# Patient Record
Sex: Male | Born: 1995
Health system: Southern US, Community
[De-identification: ages and names within clinical notes are randomized; demographics above are authoritative.]

## PROBLEM LIST (undated history)

## (undated) DIAGNOSIS — E785 Hyperlipidemia, unspecified: Secondary | ICD-10-CM

## (undated) DIAGNOSIS — IMO0001 Reserved for inherently not codable concepts without codable children: Secondary | ICD-10-CM

## (undated) DIAGNOSIS — K219 Gastro-esophageal reflux disease without esophagitis: Secondary | ICD-10-CM

## (undated) DIAGNOSIS — L309 Dermatitis, unspecified: Secondary | ICD-10-CM

## (undated) HISTORY — DX: Hyperlipidemia, unspecified: E78.5

## (undated) HISTORY — PX: WISDOM TOOTH EXTRACTION: SHX21

## (undated) HISTORY — DX: Gastro-esophageal reflux disease without esophagitis: K21.9

## (undated) HISTORY — DX: Dermatitis, unspecified: L30.9

---
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
## 2013-01-15 ENCOUNTER — Emergency Department (HOSPITAL_COMMUNITY)
Admission: EM | Admit: 2013-01-15 | Discharge: 2013-01-15 | Disposition: A | Discharge status: Home / Self Care | Payer: BC Managed Care – PPO | Attending: Emergency Medicine | Admitting: Emergency Medicine

## 2013-01-15 ENCOUNTER — Encounter (HOSPITAL_COMMUNITY): Payer: Self-pay | Admitting: Emergency Medicine

## 2013-01-15 DIAGNOSIS — Y9389 Activity, other specified: Secondary | ICD-10-CM | POA: Insufficient documentation

## 2013-01-15 DIAGNOSIS — Z23 Encounter for immunization: Secondary | ICD-10-CM | POA: Insufficient documentation

## 2013-01-15 DIAGNOSIS — S0990XA Unspecified injury of head, initial encounter: Secondary | ICD-10-CM

## 2013-01-15 DIAGNOSIS — M79602 Pain in left arm: Secondary | ICD-10-CM

## 2013-01-15 DIAGNOSIS — Z8719 Personal history of other diseases of the digestive system: Secondary | ICD-10-CM | POA: Insufficient documentation

## 2013-01-15 DIAGNOSIS — S0091XA Abrasion of unspecified part of head, initial encounter: Secondary | ICD-10-CM

## 2013-01-15 DIAGNOSIS — Y92410 Unspecified street and highway as the place of occurrence of the external cause: Secondary | ICD-10-CM | POA: Insufficient documentation

## 2013-01-15 DIAGNOSIS — S4980XA Other specified injuries of shoulder and upper arm, unspecified arm, initial encounter: Secondary | ICD-10-CM | POA: Insufficient documentation

## 2013-01-15 DIAGNOSIS — S46909A Unspecified injury of unspecified muscle, fascia and tendon at shoulder and upper arm level, unspecified arm, initial encounter: Secondary | ICD-10-CM | POA: Insufficient documentation

## 2013-01-15 DIAGNOSIS — IMO0002 Reserved for concepts with insufficient information to code with codable children: Secondary | ICD-10-CM | POA: Insufficient documentation

## 2013-01-15 HISTORY — DX: Reserved for inherently not codable concepts without codable children: IMO0001

## 2013-01-15 HISTORY — DX: Gastro-esophageal reflux disease without esophagitis: K21.9

## 2013-01-15 MED ORDER — TETANUS-DIPHTH-ACELL PERTUSSIS 5-2.5-18.5 LF-MCG/0.5 IM SUSP
0.5000 mL | Freq: Once | INTRAMUSCULAR | Status: AC
  Administered 2013-01-15: 0.5 mL via INTRAMUSCULAR
  Filled 2013-01-15: qty 0.5
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
## 2014-02-18 ENCOUNTER — Ambulatory Visit (INDEPENDENT_AMBULATORY_CARE_PROVIDER_SITE_OTHER): Payer: BC Managed Care – PPO | Admitting: Emergency Medicine

## 2014-02-18 VITALS — BP 118/70 | HR 72 | Temp 98.1°F | Resp 18 | Ht 68.25 in | Wt 193.6 lb

## 2014-02-18 DIAGNOSIS — M26609 Unspecified temporomandibular joint disorder, unspecified side: Secondary | ICD-10-CM

## 2014-02-18 DIAGNOSIS — M2660 Temporomandibular joint disorder, unspecified: Secondary | ICD-10-CM

## 2014-02-18 MED ORDER — MELOXICAM 15 MG PO TABS: 15.0000 mg | ORAL_TABLET | Freq: Every day | ORAL | Status: DC

## 2014-02-18 NOTE — Progress Notes (Addendum)
Subjective:  This chart was scribed for Lesle ChrisSteven Margie Urbanowicz, MD by Carl Bestelina Holson, Medical Scribe. This patient was seen in Room 5 and the patient's care was started at 1:19 PM.   Patient ID: Joseph Whitehead, male    DOB: [birth year], 18 y.o.   MRN: [id number]  HPI HPI Comments: Joseph Keasreston L Beal is a 18 y.o. male who presents to the [facility] complaining of constant left ear pain that started 2 weeks ago.  He has been using Q-Tips.  He does not have any wisdom teeth.  His jaw does not lock and he has not been stressed out lately.  He is only depressed when he sees his friends going to school.  He denies cough, SI, sore throat, hearing loss, and rhinorrhea as associated symptoms.  He dropped out of [employer] this semester and is at home trying to figure out what he wants to do with his life.  He has a stable household and can talk to his mom and best friend about his problems.  He denies using drugs or alcohol.    There are no active problems to display for this patient.  Past Medical History  Diagnosis Date  . Reflux    Past Surgical History  Procedure Laterality Date  . Wisdom tooth extraction     Allergies  Allergen Reactions  . Penicillins Other (See Comments)    Pt. Is unsure of reaction.    Prior to Admission medications   Not on File   History   Social History  . Marital Status: Single    Spouse Name: N/A    Number of Children: N/A  . Years of Education: N/A   Occupational History  . Not on file.   Social History Main Topics  . Smoking status: Never Smoker   . Smokeless tobacco: Not on file  . Alcohol Use: No  . Drug Use: No  . Sexual Activity: Not on file   Other Topics Concern  . Not on file   Social History Narrative     Review of Systems  HENT: Positive for ear pain. Negative for hearing loss, rhinorrhea and sore throat.   Respiratory: Negative for cough.   Psychiatric/Behavioral: Negative for suicidal ideas.    Objective:  Physical  Exam  Constitutional: He is oriented to person, place, and time. He appears well-developed and well-nourished.  HENT:  Head: Normocephalic and atraumatic.  Right Ear: Hearing, tympanic membrane, external ear and ear canal normal.  Left Ear: Hearing, tympanic membrane, external ear and ear canal normal.  There is tenderness over the left TMJ. There is tenderness just below the ear. Weber and Rinne  testing are normal  Eyes: EOM are normal.  Neck: Normal range of motion.  Cardiovascular: Normal rate.   Pulmonary/Chest: Effort normal.  Musculoskeletal: Normal range of motion.  Neurological: He is alert and oriented to person, place, and time.  Skin: Skin is warm and dry.  Psychiatric: He has a normal mood and affect. His behavior is normal.  Nursing note and vitals reviewed.    BP 118/70 mmHg  Pulse 72  Temp(Src) 98.1 F (36.7 C) (Oral)  Resp 18  Ht 5' 8.25" (1.734 m)  Wt 193 lb 9.6 oz (87.816 kg)  BMI 29.21 kg/m2  SpO2 98% Assessment & Plan:  We'll treat with meloxicam. I did have his father come in the room. He has a stable home situation father's truck driver he has a good relationship with his mom. He  [place] to the [place] [employer] in [place]. Will recheck if his symptoms persist.I personally performed the services described in this documentation, which was scribed in my presence. The recorded information has been reviewed and is accurate.

## 2014-02-18 NOTE — Patient Instructions (Signed)
Temporomandibular Problems  Temporomandibular joint (TMJ) dysfunction means there are problems with the joint between your jaw and your skull. This is a joint lined by cartilage like other joints in your body but also has a small disc in the joint which keeps the bones from rubbing on each other. These joints are like other joints and can get inflamed (sore) from arthritis and other problems. When this joint gets sore, it can cause headaches and pain in the jaw and the face. CAUSES  Usually the arthritic types of problems are caused by soreness in the joint. Soreness in the joint can also be caused by overuse. This may come from grinding your teeth. It may also come from mis-alignment in the joint. DIAGNOSIS Diagnosis of this condition can often be made by history and exam. Sometimes your caregiver may need X-rays or an MRI scan to determine the exact cause. It may be necessary to see your dentist to determine if your teeth and jaws are lined up correctly. TREATMENT  Most of the time this problem is not serious; however, sometimes it can persist (become chronic). When this happens medications that will cut down on inflammation (soreness) help. Sometimes a shot of cortisone into the joint will be helpful. If your teeth are not aligned it may help for your dentist to make a splint for your mouth that can help this problem. If no physical problems can be found, the problem may come from tension. If tension is found to be the cause, biofeedback or relaxation techniques may be helpful. HOME CARE INSTRUCTIONS   Later in the day, applications of ice packs may be helpful. Ice can be used in a plastic bag with a towel around it to prevent frostbite to skin. This may be used about every 2 hours for 20 to 30 minutes, as needed while awake, or as directed by your caregiver.  Only take over-the-counter or prescription medicines for pain, discomfort, or fever as directed by your caregiver.  If physical therapy was  prescribed, follow your caregiver's directions.  Wear mouth appliances as directed if they were given. Document Released: 12/01/2000 Document Revised: 05/31/2011 Document Reviewed: 03/10/2008 Exit[employer] Patient Information 2015 [employer]. This information is not intended to replace advice given to you by your health care provider. Make sure you discuss any questions you have with your health care provider.  

## 2014-02-18 NOTE — Addendum Note (Signed)
Addended by: Johnnette LitterARDWELL, Shawnn Bouillon M on: 02/18/2014 03:01 PM   Modules accepted: Level of Service

## 2014-08-02 ENCOUNTER — Encounter: Payer: Self-pay | Admitting: Family Medicine

## 2014-08-05 ENCOUNTER — Telehealth: Payer: Self-pay | Admitting: Family Medicine

## 2014-08-05 NOTE — Telephone Encounter (Signed)
Called to let pt know about being accepted into practice and to set up an apt with Dr Pickard °

## 2014-10-07 ENCOUNTER — Ambulatory Visit (INDEPENDENT_AMBULATORY_CARE_PROVIDER_SITE_OTHER): Payer: BC Managed Care – PPO | Admitting: Family Medicine

## 2014-10-07 ENCOUNTER — Encounter: Payer: Self-pay | Admitting: Family Medicine

## 2014-10-07 VITALS — BP 128/74 | HR 74 | Temp 98.5°F | Resp 18 | Ht 68.5 in | Wt 190.0 lb

## 2014-10-07 DIAGNOSIS — Z0000 Encounter for general adult medical examination without abnormal findings: Secondary | ICD-10-CM | POA: Diagnosis not present

## 2014-10-07 NOTE — Progress Notes (Signed)
Subjective:    Patient ID: Joseph Whitehead, male    DOB: 11/11/1995, 19 y.o.   MRN: [id number]  HPI  Patient is here today for complete physical exam and to establish care. He also complains of pain in his left ankle as well as his left ear. Patient sprained his left ankle approximately 4 weeks ago. Ever since that time he has had pain located over the medial malleolus. It is exacerbated by prolonged standing and walking. On examination today there is no swelling. There is no ecchymosis. There is no tenderness to palpation over the medial or lateral malleolus. He has normal flexion and extension without pain. There is no instability of the ankle. He has negative anterior drawer sign. Patient also reports pain in his left ear. On examination the left tympanic membrane is completely normal. There is no inflammation the left external auditory canal. Patient does report that he grinds his teeth at night due to anxiety. The pain is usually worse after sleeping. It is also worse when he chews Past Medical History  Diagnosis Date  . Reflux   . GERD (gastroesophageal reflux disease)    Past Surgical History  Procedure Laterality Date  . Wisdom tooth extraction     No current outpatient prescriptions on file prior to visit.   No current facility-administered medications on file prior to visit.   Allergies  Allergen Reactions  . Penicillins Other (See Comments)    Pt. Is unsure of reaction.    History   Social History  . Marital Status: Single    Spouse Name: N/A  . Number of Children: N/A  . Years of Education: N/A   Occupational History  . Not on file.   Social History Main Topics  . Smoking status: Never Smoker   . Smokeless tobacco: Never Used  . Alcohol Use: No  . Drug Use: No  . Sexual Activity: No     Comment: works as Firefighter, at [employer]     Other Topics Concern  . Not on file   Social History Narrative   Family History  Problem Relation Age of Onset  .  Diabetes Mother   . Hyperlipidemia Mother   . Hypertension Mother   . Hypertension Father   . Cancer Maternal Grandmother     breast     Review of Systems  All other systems reviewed and are negative.      Objective:   Physical Exam  Constitutional: He is oriented to person, place, and time. He appears well-developed and well-nourished. No distress.  HENT:  Head: Normocephalic and atraumatic.  Right Ear: External ear normal.  Left Ear: External ear normal.  Nose: Nose normal.  Mouth/Throat: Oropharynx is clear and moist. No oropharyngeal exudate.  Eyes: Conjunctivae and EOM are normal. Pupils are equal, round, and reactive to light. Right eye exhibits no discharge. Left eye exhibits no discharge. No scleral icterus.  Neck: Normal range of motion. Neck supple. No JVD present. No tracheal deviation present. No thyromegaly present.  Cardiovascular: Normal rate, regular rhythm, normal heart sounds and intact distal pulses.  Exam reveals no gallop and no friction rub.   No murmur heard. Pulmonary/Chest: Effort normal and breath sounds normal. No stridor. No respiratory distress. He has no wheezes. He has no rales. He exhibits no tenderness.  Abdominal: Soft. Bowel sounds are normal. He exhibits no distension and no mass. There is no tenderness. There is no rebound and no guarding.  Musculoskeletal: Normal range of motion.  He exhibits no edema or tenderness.  Lymphadenopathy:    He has no cervical adenopathy.  Neurological: He is alert and oriented to person, place, and time. He has normal reflexes. He displays normal reflexes. No cranial nerve deficit. He exhibits normal muscle tone. Coordination normal.  Skin: Skin is warm. No rash noted. He is not diaphoretic. No erythema. No pallor.  Psychiatric: He has a normal mood and affect. His behavior is normal. Judgment and thought content normal.  Vitals reviewed.         Assessment & Plan:  Routine general medical examination at a  health care facility  Patient's physical exam is normal outside of his weight. I believe the patient is suffering from TMJ. I recommended he wear a mouthguard at night and take ibuprofen 800 mg every 8 hours for the next 2 weeks. If the patient's ear pain is not improving after 2 weeks I would like to recheck him or consider adding Flexeril at night and I believe the pain in his left ankle is due to an ankle sprain. The pain is gradually improving I believe he is exacerbating this by working over the summer. His job requires him to stand and walk on hard floors for 8 hour shifts. I recommended he buy a lace up ankle brace and wear that while at work to provide more stability and support to the ankle. I've also recommended he elevate and ice his ankle after he works and take ibuprofen 800 mg every 8 hours as needed for pain. If the patient's left ankle pain is not better in 2 weeks I would like to proceed with an x-ray. I would also like him to return fasting for a CBC, CMP, and fasting lipid panel

## 2014-10-30 ENCOUNTER — Other Ambulatory Visit: Payer: BC Managed Care – PPO

## 2014-10-30 DIAGNOSIS — Z0000 Encounter for general adult medical examination without abnormal findings: Secondary | ICD-10-CM

## 2014-10-30 LAB — CBC WITH DIFFERENTIAL/PLATELET
BASOS PCT: 1 % (ref 0–1)
Basophils Absolute: 0.1 10*3/uL (ref 0.0–0.1)
EOS ABS: 0.2 10*3/uL (ref 0.0–0.7)
EOS PCT: 3 % (ref 0–5)
HCT: 40 % (ref 39.0–52.0)
Hemoglobin: 12.8 g/dL — ABNORMAL LOW (ref 13.0–17.0)
LYMPHS ABS: 2.5 10*3/uL (ref 0.7–4.0)
Lymphocytes Relative: 40 % (ref 12–46)
MCH: 25.2 pg — AB (ref 26.0–34.0)
MCHC: 32 g/dL (ref 30.0–36.0)
MCV: 78.9 fL (ref 78.0–100.0)
MPV: 10.7 fL (ref 8.6–12.4)
Monocytes Absolute: 0.4 10*3/uL (ref 0.1–1.0)
Monocytes Relative: 7 % (ref 3–12)
Neutro Abs: 3 10*3/uL (ref 1.7–7.7)
Neutrophils Relative %: 49 % (ref 43–77)
PLATELETS: 229 10*3/uL (ref 150–400)
RBC: 5.07 MIL/uL (ref 4.22–5.81)
RDW: 15.7 % — AB (ref 11.5–15.5)
WBC: 6.2 10*3/uL (ref 4.0–10.5)

## 2014-10-30 LAB — LIPID PANEL
CHOL/HDL RATIO: 5.1 ratio — AB (ref <=5.0)
Cholesterol: 189 mg/dL — ABNORMAL HIGH (ref 125–170)
HDL: 37 mg/dL — AB (ref >=40)
LDL Cholesterol: 143 mg/dL — ABNORMAL HIGH (ref <110)
Triglycerides: 46 mg/dL (ref <150)
VLDL: 9 mg/dL (ref <30)

## 2014-10-30 LAB — COMPLETE METABOLIC PANEL WITH GFR
ALBUMIN: 4.2 g/dL (ref 3.6–5.1)
ALK PHOS: 98 U/L (ref 40–115)
ALT: 8 U/L — ABNORMAL LOW (ref 9–46)
AST: 14 U/L (ref 10–40)
BUN: 15 mg/dL (ref 7–25)
CALCIUM: 9.2 mg/dL (ref 8.6–10.3)
CHLORIDE: 102 mmol/L (ref 98–110)
CO2: 27 mmol/L (ref 20–31)
Creat: 0.85 mg/dL (ref 0.60–1.35)
GFR, Est African American: >89 mL/min (ref >=60)
GFR, Est Non African American: >89 mL/min (ref >=60)
GLUCOSE: 88 mg/dL (ref 70–99)
POTASSIUM: 4 mmol/L (ref 3.5–5.3)
SODIUM: 139 mmol/L (ref 135–146)
TOTAL PROTEIN: 7.2 g/dL (ref 6.1–8.1)
Total Bilirubin: 0.4 mg/dL (ref 0.2–1.2)

## 2014-10-31 ENCOUNTER — Encounter: Payer: Self-pay | Admitting: Family Medicine

## 2017-03-21 ENCOUNTER — Ambulatory Visit: Payer: No Typology Code available for payment source | Admitting: Family Medicine

## 2017-03-21 ENCOUNTER — Encounter: Payer: Self-pay | Admitting: Family Medicine

## 2017-03-21 VITALS — BP 118/76 | HR 78 | Temp 98.4°F | Resp 12 | Ht 69.5 in | Wt 205.0 lb

## 2017-03-21 DIAGNOSIS — L309 Dermatitis, unspecified: Secondary | ICD-10-CM

## 2017-03-21 MED ORDER — PREDNISONE 20 MG PO TABS: ORAL_TABLET | ORAL | 0 refills | Status: DC

## 2017-03-21 MED ORDER — MOMETASONE FUROATE 0.1 % EX CREA: 1.0000 "application " | TOPICAL_CREAM | Freq: Every day | CUTANEOUS | 1 refills | Status: DC

## 2017-03-21 NOTE — Progress Notes (Signed)
   Subjective:    Patient ID: Joseph Whitehead, male    DOB: 09-01-1995, 21 y.o.   MRN: [id number]  HPI The patient has been battling a rash off and on for 2 months. The rash is severe around both ears and behind his ear. The rash is erythematous dry cracked bumpy skin which is extremely pruritic. He also has similar patches on the medial surfaces of both eyes and on horizontal patch at his belt line on his abdomen. In these areas the rash is thick and severe and eczematiform.  He has a similar rash but not as severe on the flexor surfaces of both arms and behind both knees Past Medical History:  Diagnosis Date  . GERD (gastroesophageal reflux disease)   . Reflux    Past Surgical History:  Procedure Laterality Date  . WISDOM TOOTH EXTRACTION     No current outpatient medications on file prior to visit.   No current facility-administered medications on file prior to visit.    Allergies  Allergen Reactions  . Penicillins Other (See Comments)    Pt. Is unsure of reaction.    Social History   Socioeconomic History  . Marital status: Single    Spouse name: Not on file  . Number of children: Not on file  . Years of education: Not on file  . Highest education level: Not on file  Social Needs  . Financial resource strain: Not on file  . Food insecurity - worry: Not on file  . Food insecurity - inability: Not on file  . Transportation needs - medical: Not on file  . Transportation needs - non-medical: Not on file  Occupational History  . Not on file  Tobacco Use  . Smoking status: Never Smoker  . Smokeless tobacco: Never Used  Substance and Sexual Activity  . Alcohol use: No  . Drug use: No  . Sexual activity: No    Comment: works as Firefighterbook binder, at [employer].    Other Topics Concern  . Not on file  Social History Narrative  . Not on file      Review of Systems  All other systems reviewed and are negative.      Objective:   Physical Exam  Constitutional: He appears  well-developed and well-nourished.  HENT:  Head:    Cardiovascular: Normal rate, regular rhythm and normal heart sounds.  Pulmonary/Chest: Effort normal and breath sounds normal. No respiratory distress. He has no wheezes. He has no rales.  Skin: Rash noted. There is erythema.     Vitals reviewed.         Assessment & Plan:  Eczema, unspecified type - Plan: predniSONE (DELTASONE) 20 MG tablet, mometasone (ELOCON) 0.1 % cream  Patient appears to have moderate to severe eczema in the area is outlined on the diagram. Rash is so widespread topical steroids would be difficult to administer. Therefore I will use a short duration oral prednisone taper pack to gain some control over the rash and then follow-up with topical Elocon cream applied once daily for less than a week to calm this down. Then the generous use of moisturizers to prevent in the future from occurring such as Vaseline, etc.

## 2017-03-29 ENCOUNTER — Telehealth: Payer: Self-pay | Admitting: Family Medicine

## 2017-03-29 DIAGNOSIS — L309 Dermatitis, unspecified: Secondary | ICD-10-CM

## 2017-03-29 MED ORDER — TRIAMCINOLONE ACETONIDE 0.1 % EX CREA: 1.0000 "application " | TOPICAL_CREAM | Freq: Two times a day (BID) | CUTANEOUS | 0 refills | Status: DC

## 2017-03-29 NOTE — Telephone Encounter (Signed)
Med sent to pharm and pt aware via vm 

## 2017-03-29 NOTE — Telephone Encounter (Signed)
Triamcinolone 0.1 % cream bid for 10 days.

## 2017-03-29 NOTE — Telephone Encounter (Signed)
Elocon cream is apparently on back order can we changed this to something else?

## 2017-04-14 ENCOUNTER — Ambulatory Visit: Payer: BC Managed Care – PPO | Admitting: Family Medicine

## 2017-04-14 ENCOUNTER — Telehealth: Payer: Self-pay | Admitting: *Deleted

## 2017-04-14 ENCOUNTER — Encounter: Payer: Self-pay | Admitting: Family Medicine

## 2017-04-14 VITALS — BP 110/58 | HR 84 | Temp 98.7°F | Resp 12 | Ht 70.0 in | Wt 211.0 lb

## 2017-04-14 DIAGNOSIS — L309 Dermatitis, unspecified: Secondary | ICD-10-CM

## 2017-04-14 MED ORDER — CRISABOROLE 2 % EX OINT: 1.0000 "application " | TOPICAL_OINTMENT | Freq: Every day | CUTANEOUS | 11 refills | Status: DC

## 2017-04-14 NOTE — Telephone Encounter (Signed)
Received request from pharmacy for PA on   PA submitted.   Dx: L20.9- atopic dermatitis/ L30.9- Eczema

## 2017-04-14 NOTE — Telephone Encounter (Signed)
Received PA determination.   PA approved 07/14/2017- 10/13/2017.  Pharmacy made aware.

## 2017-04-14 NOTE — Progress Notes (Signed)
Subjective:    Patient ID: Joseph Whitehead, male    DOB: [birth year], 24 y.o.   MRN: [id number]  HPI  03/21/17 The patient has been battling a rash off and on for 2 months. The rash is severe around both ears and behind his ear. The rash is erythematous dry cracked bumpy skin which is extremely pruritic. He also has similar patches on the medial surfaces of both eyes and on horizontal patch at his belt line on his abdomen. In these areas the rash is thick and severe and eczematiform.  He has a similar rash but not as severe on the flexor surfaces of both arms and behind both knees.  At that time, my plan was: Patient appears to have moderate to severe eczema in the area is outlined on the diagram. Rash is so widespread topical steroids would be difficult to administer. Therefore I will use a short duration oral prednisone taper pack to gain some control over the rash and then follow-up with topical Elocon cream applied once daily for less than a week to calm this down. Then the generous use of moisturizers to prevent in the future from occurring such as Vaseline, etc.  04/14/17 Patient's rash initially improved on prednisone. Afterwards we switched him triamcinolone cream applied twice daily. Patient states that once he switched to the cream, the rash came back. Thankfully, the rash is much better. The rash behind his ears left hyperpigmentation from scarring but there is no residual papular rash. The rash on his thighs is better. However he continues to have erythematous fine papular bumps widespread all over his abdomen as well as in the flexor surfaces of both elbows. Past Medical History:  Diagnosis Date  . GERD (gastroesophageal reflux disease)   . Reflux    Past Surgical History:  Procedure Laterality Date  . WISDOM TOOTH EXTRACTION     Current Outpatient Medications on File Prior to Visit  Medication Sig Dispense Refill  . triamcinolone cream (KENALOG) 0.1 % Apply 1 application  topically 2 (two) times daily. X 10 days (Patient not taking: Reported on 04/14/2017) 30 g 0   No current facility-administered medications on file prior to visit.    Allergies  Allergen Reactions  . Penicillins Other (See Comments)    Pt. Is unsure of reaction.    Social History   Socioeconomic History  . Marital status: Single    Spouse name: Not on file  . Number of children: Not on file  . Years of education: Not on file  . Highest education level: Not on file  Social Needs  . Financial resource strain: Not on file  . Food insecurity - worry: Not on file  . Food insecurity - inability: Not on file  . Transportation needs - medical: Not on file  . Transportation needs - non-medical: Not on file  Occupational History  . Not on file  Tobacco Use  . Smoking status: Never Smoker  . Smokeless tobacco: Never Used  Substance and Sexual Activity  . Alcohol use: No  . Drug use: No  . Sexual activity: No    Comment: works as Firefighter, at [employer]    Other Topics Concern  . Not on file  Social History Narrative  . Not on file      Review of Systems  All other systems reviewed and are negative.      Objective:   Physical Exam  Constitutional: He appears well-developed and well-nourished.  HENT:  Head:  Cardiovascular: Normal rate, regular rhythm and normal heart sounds.  Pulmonary/Chest: Effort normal and breath sounds normal. No respiratory distress. He has no wheezes. He has no rales.  Skin: Rash noted. There is erythema.     Vitals reviewed.         Assessment & Plan:  Eczema We discussed his options which include strong steroid creams intermixed with Vaseline with multiple applications per day versus trying eucrisa.  Patient ultimately elects to try eucrisa 2% ointment applied once daily. He will try this for 1-2 weeks to see if the rash improves. If so I gave him a prescription to fill in the future. He can use this daily as a preventative. We also  discussed the liberal use of moisturizers and using non-abrasive detergents and soaps such as Dove.

## 2017-04-14 NOTE — Telephone Encounter (Signed)
Your information has been submitted to [employer]. To check for an updated outcome later, reopen this PA request from your dashboard. If [employer] has not responded to your request within 24 hours, contact [employer] at [phone]. If you think there may be a problem with your PA request, use our live chat feature at the bottom right.    

## 2017-09-19 ENCOUNTER — Other Ambulatory Visit: Payer: Self-pay

## 2017-09-19 ENCOUNTER — Ambulatory Visit: Payer: BC Managed Care – PPO | Admitting: Physician Assistant

## 2017-09-19 ENCOUNTER — Encounter: Payer: Self-pay | Admitting: Physician Assistant

## 2017-09-19 VITALS — BP 124/86 | HR 71 | Temp 98.3°F | Resp 14 | Ht 70.0 in | Wt 214.8 lb

## 2017-09-19 DIAGNOSIS — L309 Dermatitis, unspecified: Secondary | ICD-10-CM

## 2017-09-19 DIAGNOSIS — B354 Tinea corporis: Secondary | ICD-10-CM

## 2017-09-19 MED ORDER — NYSTATIN 100000 UNIT/GM EX CREA: 1.0000 "application " | TOPICAL_CREAM | Freq: Two times a day (BID) | CUTANEOUS | 0 refills | Status: DC

## 2017-09-19 MED ORDER — CRISABOROLE 2 % EX OINT: 1.0000 "application " | TOPICAL_OINTMENT | Freq: Every day | CUTANEOUS | 11 refills | Status: DC

## 2017-09-19 NOTE — Progress Notes (Signed)
    Patient ID: Lum Keasreston L Otte MRN: [id number], DOB: Oct 03, 1995, 22 y.o. Date of Encounter: 09/19/2017, 9:26 AM    Chief Complaint:  Chief Complaint  Patient presents with  . Rash    between legs. behind ears, on head      HPI: 22 y.o. year old male presents with above.   States that in the past he has been seen here with some areas of rash--- and at one point was prescribed some pills to use--- and at one point was used prescribed a different type of cream-- and then at another visit was prescribed Saint MartinEucrisa.  Says that he was not sure which type of treatment he needed for these areas of rash.  Has area of rash on the back side of his right ear.  Also has noticed some rash in the skin folds/groin creases of upper thighs.     Home Meds:   Outpatient Medications Prior to Visit  Medication Sig Dispense Refill  . Crisaborole (EUCRISA) 2 % OINT Apply 1 application topically daily. 60 g 11  . triamcinolone cream (KENALOG) 0.1 % Apply 1 application topically 2 (two) times daily. X 10 days (Patient not taking: Reported on 04/14/2017) 30 g 0   No facility-administered medications prior to visit.     Allergies:  Allergies  Allergen Reactions  . Penicillins Other (See Comments)    Pt. Is unsure of reaction.       Review of Systems: See HPI for pertinent ROS. All other ROS negative.    Physical Exam: Blood pressure 124/86, pulse 71, temperature 98.3 F (36.8 C), temperature source Oral, resp. rate 14, height 5\' 10"  (1.778 m), weight 97.4 kg (214 lb 12.8 oz), SpO2 99 %., Body mass index is 30.82 kg/m. General:  Appears in no acute distress. Neck: Supple. No thyromegaly. No lymphadenopathy. Lungs: Clear bilaterally to auscultation without wheezes, rales, or rhonchi. Breathing is unlabored. Heart: Regular rhythm. No murmurs, rubs, or gallops. Msk:  Strength and tone normal for age. Skin: At posterior aspect/base of right ear--- area of erythema, scale. At skin fold-- upper  right thigh----there is diffuse erythema.  Neuro: Alert and oriented X 3. Moves all extremities spontaneously. Gait is normal. CNII-XII grossly in tact. Psych:  Responds to questions appropriately with a normal affect.     ASSESSMENT AND PLAN:  22 y.o. year old male with   1. Eczema, unspecified type Rash on the posterior aspect of the right ear is consistent with eczema.  He is to apply the Saint MartinEucrisa to this area.  2. Candidiasis Rash in the skin folds at his upper thigh/groin crease is consistent with candidia.  Is to apply nystatin cream to those areas.  Also may benefit from over-the-counter "jock itch "treatment.  Use above treatments.  Follow-up if does not resolve over the next 2 to 3 weeks.   Signed, [address]Cinda Hara Beth Las Lomas[place]ixon, [place], [facility] 09/19/2017 9:26 AM

## 2017-10-03 ENCOUNTER — Encounter: Payer: Self-pay | Admitting: Family Medicine

## 2017-10-03 ENCOUNTER — Ambulatory Visit: Payer: BC Managed Care – PPO | Admitting: Family Medicine

## 2017-10-03 VITALS — BP 120/72 | HR 60 | Temp 98.4°F | Resp 16 | Ht 70.0 in | Wt 211.0 lb

## 2017-10-03 DIAGNOSIS — L309 Dermatitis, unspecified: Secondary | ICD-10-CM | POA: Diagnosis not present

## 2017-10-03 MED ORDER — TRIAMCINOLONE ACETONIDE 0.1 % EX CREA: 1.0000 "application " | TOPICAL_CREAM | Freq: Two times a day (BID) | CUTANEOUS | 0 refills | Status: DC

## 2017-10-03 MED ORDER — CRISABOROLE 2 % EX OINT: 1.0000 "application " | TOPICAL_OINTMENT | Freq: Every day | CUTANEOUS | 11 refills | Status: DC

## 2017-10-03 NOTE — Progress Notes (Signed)
Subjective:    Patient ID: Joseph Whitehead, male    DOB: 12-04-1995, 22 y.o.   MRN: [id number]  HPI  Recently has been treated by my partner with nystatin cream for intertrigo.  He had an erythematous itchy rash on his upper suprapubic skin above the shaft of his penis as well as in the crease between his scrotum and thighs bilaterally.  He also had an itchy rash behind his right ear and on both lower legs.  He uses nystatin twice daily for a week without any improvement however once he started using his Eucrisa and those affected areas, the rash slowly subsided and is gone away.  He still has some mild erythema and rash but has improved dramatically.  It appears as though he had mild intetrigo that then exacerbated his underlying eczema.  However he is now out of his Pam Drown because he has been using it twice a day.  He needs a refill Past Medical History:  Diagnosis Date  . GERD (gastroesophageal reflux disease)   . Reflux    Past Surgical History:  Procedure Laterality Date  . WISDOM TOOTH EXTRACTION     Current Outpatient Medications on File Prior to Visit  Medication Sig Dispense Refill  . nystatin cream (MYCOSTATIN) Apply 1 application topically 2 (two) times daily. (Patient not taking: Reported on 10/03/2017) 30 g 0   No current facility-administered medications on file prior to visit.    Allergies  Allergen Reactions  . Penicillins Other (See Comments)    Pt. Is unsure of reaction.    Social History   Socioeconomic History  . Marital status: Single    Spouse name: Not on file  . Number of children: Not on file  . Years of education: Not on file  . Highest education level: Not on file  Occupational History  . Not on file  Social Needs  . Financial resource strain: Not on file  . Food insecurity:    Worry: Not on file    Inability: Not on file  . Transportation needs:    Medical: Not on file    Non-medical: Not on file  Tobacco Use  . Smoking status: Never  Smoker  . Smokeless tobacco: Never Used  Substance and Sexual Activity  . Alcohol use: No  . Drug use: No  . Sexual activity: Never    Comment: works as Firefighter, at [employer]    Lifestyle  . Physical activity:    Days per week: Not on file    Minutes per session: Not on file  . Stress: Not on file  Relationships  . Social connections:    Talks on phone: Not on file    Gets together: Not on file    Attends religious service: Not on file    Active member of club or organization: Not on file    Attends meetings of clubs or organizations: Not on file    Relationship status: Not on file  . Intimate partner violence:    Fear of current or ex partner: Not on file    Emotionally abused: Not on file    Physically abused: Not on file    Forced sexual activity: Not on file  Other Topics Concern  . Not on file  Social History Narrative  . Not on file      Review of Systems  All other systems reviewed and are negative.      Objective:   Physical Exam  Constitutional: He appears  well-developed and well-nourished.  HENT:  Head:    Cardiovascular: Normal rate, regular rhythm and normal heart sounds.  Pulmonary/Chest: Effort normal and breath sounds normal. No respiratory distress. He has no wheezes. He has no rales.  Skin: Rash noted. There is erythema.     Vitals reviewed.         Assessment & Plan:  Eczema I refilled the patient's eucrisa.  I recommended that he apply a dose to the affected areas after he takes a shower at night after he is dried all thoroughly.  During the day he can use Vaseline as an emollient to the affected areas.  We also discussed the importance of keeping the skin between his thigh and scrotum dry as well as the skin underneath his pannus in his lower abdomen drying clean to avoid intertrigo in the future.  Patient's insurance will not pay for his medication for 2 more weeks because he is been using it more regularly and is not time to refill his  prescription.  Therefore I recommended temporarily using triamcinolone cream twice daily to the affected areas until he is able to pick up the eucrisa.

## 2017-10-06 ENCOUNTER — Encounter (HOSPITAL_COMMUNITY): Payer: Self-pay

## 2017-10-06 ENCOUNTER — Emergency Department (HOSPITAL_COMMUNITY)
Admission: EM | Admit: 2017-10-06 | Discharge: 2017-10-06 | Disposition: A | Discharge status: Home / Self Care | Payer: BC Managed Care – PPO | Attending: Emergency Medicine | Admitting: Emergency Medicine

## 2017-10-06 DIAGNOSIS — Z79899 Other long term (current) drug therapy: Secondary | ICD-10-CM | POA: Diagnosis not present

## 2017-10-06 DIAGNOSIS — N50819 Testicular pain, unspecified: Secondary | ICD-10-CM | POA: Insufficient documentation

## 2017-10-06 LAB — URINALYSIS, ROUTINE W REFLEX MICROSCOPIC
BACTERIA UA: NONE SEEN
Bilirubin Urine: NEGATIVE
Glucose, UA: NEGATIVE mg/dL
Ketones, ur: NEGATIVE mg/dL
Leukocytes, UA: NEGATIVE
NITRITE: NEGATIVE
PH: 7 (ref 5.0–8.0)
PROTEIN: NEGATIVE mg/dL
Specific Gravity, Urine: 1.028 (ref 1.005–1.030)

## 2017-10-06 MED ORDER — IBUPROFEN 400 MG PO TABS
600.0000 mg | ORAL_TABLET | Freq: Once | ORAL | Status: AC
  Administered 2017-10-06: 600 mg via ORAL
  Filled 2017-10-06: qty 1

## 2017-10-06 NOTE — ED Notes (Signed)
Pain gradually got worse since yesterday after placing cream

## 2017-10-06 NOTE — ED Provider Notes (Addendum)
[facility]   CSN: [id number] Arrival date & time: 10/06/17  0259     History   Chief Complaint Chief Complaint  Patient presents with  . Testicle Pain    HPI Joseph Whitehead is a 22 y.o. male with a history of eczema who presents with scrotal pain.   Patient reports severe testicular pain which began 2 days prior to presentation. Patient states that he has had several eczema flares in the last several months. Most recent episode began 1 weeks ago with several areas of eczematous rash behind his right ear, stomach and right calf. He states that he tried to use his previously prescribed Crisabarole and Nystatin creams but symptoms were not alleviated. He called his PCP who then prescribed Triamcinolone (2 days prior to presentation). Within hours of applying the Triamcinolone cream, he began to have intermittent scrotal pain. His pain continued throughout the week and last night at 2:30 AM he began to woke up with severe 10/10 scrotal pain. He became concerned and decided to come to the ER.  Denies testicular pain, scrotal swelling, fever, and lower urinary tract symptoms (frequency, urgency, dysuria). Denies history of inguinal or scrotal surgery.   Past Medical History:  Diagnosis Date  . GERD (gastroesophageal reflux disease)   . Reflux     There are no active problems to display for this patient.   Past Surgical History:  Procedure Laterality Date  . WISDOM TOOTH EXTRACTION          Home Medications    Prior to Admission medications   Medication Sig Start Date End Date Taking? Authorizing Provider  Crisaborole (EUCRISA) 2 % OINT Apply 1 application topically daily. 10/03/17   Donita BrooksPickard, Warren T, MD  nystatin cream (MYCOSTATIN) Apply 1 application topically 2 (two) times daily. Patient not taking: Reported on 10/03/2017 09/19/17   Allayne Butcherixon, Mary B, PA-C  triamcinolone cream (KENALOG) 0.1 % Apply 1 application topically 2  (two) times daily. 10/03/17   Donita BrooksPickard, Warren T, MD    Family History Family History  Problem Relation Age of Onset  . Diabetes Mother   . Hyperlipidemia Mother   . Hypertension Mother   . Hypertension Father   . Cancer Maternal Grandmother        breast    Social History Social History   Tobacco Use  . Smoking status: Never Smoker  . Smokeless tobacco: Never Used  Substance Use Topics  . Alcohol use: No  . Drug use: No     Allergies   Penicillins   Review of Systems Review of Systems  Constitutional: Negative for activity change, fatigue and fever.  HENT: Negative for congestion.   Respiratory: Positive for shortness of breath. Negative for chest tightness.   Cardiovascular: Negative for chest pain.  Gastrointestinal: Negative for constipation, diarrhea, nausea and vomiting.  Genitourinary: Positive for testicular pain. Negative for discharge, dysuria, frequency, hematuria, penile pain, penile swelling, scrotal swelling and urgency.  Neurological: Negative for headaches.  All other systems reviewed and are negative.    Physical Exam Updated Vital Signs BP 134/80   Pulse 72   Temp 98.5 F (36.9 C) (Oral)   Resp 16   SpO2 100%   Physical Exam  Constitutional: He is oriented to person, place, and time. He appears well-developed and well-nourished.  HENT:  Head: Normocephalic and atraumatic.  Eyes: Pupils are equal, round, and reactive to light.  Cardiovascular: Normal rate and regular rhythm.  Pulmonary/Chest: Effort normal  and breath sounds normal.  Abdominal: Soft. Bowel sounds are normal.  Genitourinary: Testes normal and penis normal. Right testis shows no mass, no swelling and no tenderness. Right testis is descended. Left testis shows no mass, no swelling and no tenderness. Left testis is descended. No penile erythema or penile tenderness. No discharge found.  Neurological: He is alert and oriented to person, place, and time.  Skin: Skin is warm and  dry. Rash (Mild macular non-blanching rash on right calf.) noted.     ED Treatments / Results  Labs (all labs ordered are listed, but only abnormal results are displayed) Labs Reviewed  URINALYSIS, ROUTINE W REFLEX MICROSCOPIC - Abnormal; Notable for the following components:      Result Value   Hgb urine dipstick SMALL (*)    All other components within normal limits    EKG None  Radiology No results found.  Procedures Procedures (including critical care time)  Medications Ordered in ED Medications - No data to display   Initial Impression / Assessment and Plan / ED Course  I have reviewed the triage vital signs and the nursing notes.  Pertinent labs & imaging results that were available during my care of the patient were reviewed by me and considered in my medical decision making (see chart for details).  22 y.o. male with a history of eczema who presents with scrotal pain and found to have scrotal skin thickening without testicular swelling or tenderness to palpation on exam. Differential diagnosis includes eczema on testicles and subsequent irritation of scrotal area. Less concerned for a fungal infection given lack of skin changes. Acute epididymitis and testicular torsion less likely given lack of testicular tenderness, swelling on palpation, and fever. - Recommend continuation of triamcinolone cream as needed for eczematous changes. - Recommend Ibuprofen PRN pain  - Recommend follow-up with PCP if symptoms continues. Provided strict return precautions including testicular swelling and fever. Patient verbalized understanding and agreement.    Final Clinical Impressions(s) / ED Diagnoses   Final diagnoses:  Testicle pain    ED Discharge Orders    None       Synetta Shadow, MD 10/06/17 [id number]    Synetta Shadow, MD 10/06/17 1191    Cathren Laine, MD 10/06/17 1350

## 2017-10-06 NOTE — ED Notes (Signed)
ED Provider at bedside. 

## 2017-10-06 NOTE — Discharge Instructions (Signed)
Please continue to use your triamcinolone cream for any rashes that you notice on your testicular area. You can take Ibuprofen 400 mg by mouth every 6 hours for your pain.

## 2017-10-06 NOTE — ED Triage Notes (Signed)
Pt reports that he has ezema that normally starts on his scrotum, pt used a new medication yesterday since has had severe testicle pain, denies swelling, or dysuria

## 2017-10-17 ENCOUNTER — Telehealth: Payer: Self-pay | Admitting: *Deleted

## 2017-10-17 NOTE — Telephone Encounter (Signed)
Wait for Determination Please wait for [employer] to return a determination.

## 2017-10-17 NOTE — Telephone Encounter (Signed)
Received request from pharmacy for PA on Eucrisa.   PA submitted.   Dx:L20.9- atopic dermatitis.

## 2017-10-18 NOTE — Telephone Encounter (Signed)
Received request for further information.   Call placed to insurance to give further information.   PA approved 10/18/2017- 10/19/2018.   PA Confirmation # [id number].  Was advised that patient will not be able to fill until 11/26/2017 as patient did get 90 day supply of medication (paid claim #[id number] gram tubes).

## 2017-11-15 ENCOUNTER — Telehealth: Payer: Self-pay | Admitting: Family Medicine

## 2017-11-15 DIAGNOSIS — L309 Dermatitis, unspecified: Secondary | ICD-10-CM

## 2017-11-15 NOTE — Telephone Encounter (Signed)
Pt called and states that the eczema has not resolved and would like to get a referral to derm - ok to do referral?

## 2017-11-15 NOTE — Telephone Encounter (Signed)
ok 

## 2017-11-15 NOTE — Telephone Encounter (Signed)
Referral placed.

## 2019-07-31 ENCOUNTER — Other Ambulatory Visit: Payer: Self-pay

## 2019-07-31 ENCOUNTER — Emergency Department (HOSPITAL_COMMUNITY): Payer: BC Managed Care – PPO

## 2019-07-31 ENCOUNTER — Emergency Department (HOSPITAL_COMMUNITY)
Admission: EM | Admit: 2019-07-31 | Discharge: 2019-07-31 | Disposition: A | Discharge status: Home / Self Care | Payer: BC Managed Care – PPO | Attending: Emergency Medicine | Admitting: Emergency Medicine

## 2019-07-31 ENCOUNTER — Encounter (HOSPITAL_COMMUNITY): Payer: Self-pay | Admitting: Emergency Medicine

## 2019-07-31 DIAGNOSIS — R1031 Right lower quadrant pain: Secondary | ICD-10-CM | POA: Diagnosis present

## 2019-07-31 DIAGNOSIS — N451 Epididymitis: Secondary | ICD-10-CM | POA: Insufficient documentation

## 2019-07-31 LAB — COMPREHENSIVE METABOLIC PANEL
ALT: 32 U/L (ref 0–44)
AST: 30 U/L (ref 15–41)
Albumin: 4.1 g/dL (ref 3.5–5.0)
Alkaline Phosphatase: 84 U/L (ref 38–126)
Anion gap: 8 (ref 5–15)
BUN: 16 mg/dL (ref 6–20)
CO2: 27 mmol/L (ref 22–32)
Calcium: 9 mg/dL (ref 8.9–10.3)
Chloride: 100 mmol/L (ref 98–111)
Creatinine, Ser: 0.9 mg/dL (ref 0.61–1.24)
GFR calc Af Amer: >60 mL/min (ref >60)
GFR calc non Af Amer: >60 mL/min (ref >60)
Glucose, Bld: 113 mg/dL — ABNORMAL HIGH (ref 70–99)
Potassium: 4 mmol/L (ref 3.5–5.1)
Sodium: 135 mmol/L (ref 135–145)
Total Bilirubin: 0.4 mg/dL (ref 0.3–1.2)
Total Protein: 8 g/dL (ref 6.5–8.1)

## 2019-07-31 LAB — URINALYSIS, ROUTINE W REFLEX MICROSCOPIC
Bilirubin Urine: NEGATIVE
Glucose, UA: NEGATIVE mg/dL
Hgb urine dipstick: NEGATIVE
Ketones, ur: 5 mg/dL — AB
Leukocytes,Ua: NEGATIVE
Nitrite: NEGATIVE
Protein, ur: NEGATIVE mg/dL
Specific Gravity, Urine: 1.026 (ref 1.005–1.030)
pH: 8 (ref 5.0–8.0)

## 2019-07-31 LAB — CBC
HCT: 37.8 % — ABNORMAL LOW (ref 39.0–52.0)
Hemoglobin: 11.5 g/dL — ABNORMAL LOW (ref 13.0–17.0)
MCH: 23.7 pg — ABNORMAL LOW (ref 26.0–34.0)
MCHC: 30.4 g/dL (ref 30.0–36.0)
MCV: 77.8 fL — ABNORMAL LOW (ref 80.0–100.0)
Platelets: 291 10*3/uL (ref 150–400)
RBC: 4.86 MIL/uL (ref 4.22–5.81)
RDW: 16.9 % — ABNORMAL HIGH (ref 11.5–15.5)
WBC: 8.7 10*3/uL (ref 4.0–10.5)
nRBC: 0 % (ref 0.0–0.2)

## 2019-07-31 LAB — LIPASE, BLOOD: Lipase: 17 U/L (ref 11–51)

## 2019-07-31 MED ORDER — SODIUM CHLORIDE 0.9% FLUSH: 3.0000 mL | Freq: Once | INTRAVENOUS | Status: DC

## 2019-07-31 MED ORDER — CIPROFLOXACIN HCL 500 MG PO TABS: 500.0000 mg | ORAL_TABLET | Freq: Two times a day (BID) | ORAL | 0 refills | Status: DC

## 2019-07-31 NOTE — ED Provider Notes (Addendum)
[facility] Provider Note   CSN: [id number] Arrival date & time: 07/31/19  1625     History Chief Complaint  Patient presents with  . Abdominal Pain  . Nausea  . Emesis    Joseph Whitehead is a 24 y.o. male.  He has a history of reflux.  Complaining of right lower quadrant abdominal pain that started this afternoon.  He said it actually started a little bit more in his flank and now is in his right lower quadrant.  Associated with some nausea.  Also felt that his right testicle was swollen.  No urinary symptoms.  No prior surgical history.  The history is provided by the patient.  Abdominal Pain Pain location:  RLQ Pain quality: aching   Pain radiates to:  Scrotum Pain severity:  Moderate Onset quality:  Sudden Timing:  Intermittent Progression:  Waxing and waning Chronicity:  New Context: not recent travel and not trauma   Relieved by:  Nothing Worsened by:  Nothing Ineffective treatments:  None tried Associated symptoms: belching and nausea   Associated symptoms: no chest pain, no diarrhea, no dysuria, no fever, no hematemesis, no hematochezia, no hematuria, no shortness of breath, no sore throat and no vomiting   Emesis Associated symptoms: abdominal pain   Associated symptoms: no diarrhea, no fever, no headaches and no sore throat        Past Medical History:  Diagnosis Date  . GERD (gastroesophageal reflux disease)   . Reflux     There are no problems to display for this patient.   Past Surgical History:  Procedure Laterality Date  . WISDOM TOOTH EXTRACTION         Family History  Problem Relation Age of Onset  . Diabetes Mother   . Hyperlipidemia Mother   . Hypertension Mother   . Hypertension Father   . Cancer Maternal Grandmother        breast    Social History   Tobacco Use  . Smoking status: Never Smoker  . Smokeless tobacco: Never Used  Substance Use Topics  . Alcohol use: No  . Drug use: No     Home Medications Prior to Admission medications   Medication Sig Start Date End Date Taking? Authorizing Provider  Crisaborole (EUCRISA) 2 % OINT Apply 1 application topically daily. 10/03/17   Donita Brooks, MD  ibuprofen (ADVIL,MOTRIN) 200 MG tablet Take 200 mg by mouth every 6 (six) hours as needed for headache.    [provider]  loratadine (CLARITIN) 10 MG tablet Take 10 mg by mouth daily as needed for allergies.    [provider]  nystatin cream (MYCOSTATIN) Apply 1 application topically 2 (two) times daily. Patient taking differently: Apply 1 application topically 2 (two) times daily as needed for dry skin.  09/19/17   Allayne Butcher B, PA-C  triamcinolone cream (KENALOG) 0.1 % Apply 1 application topically 2 (two) times daily. 10/03/17   Donita Brooks, MD    Allergies    Penicillins  Review of Systems   Review of Systems  Constitutional: Negative for fever.  HENT: Negative for sore throat.   Eyes: Negative for visual disturbance.  Respiratory: Negative for shortness of breath.   Cardiovascular: Negative for chest pain.  Gastrointestinal: Positive for abdominal pain and nausea. Negative for diarrhea, hematemesis, hematochezia and vomiting.  Genitourinary: Positive for testicular pain. Negative for dysuria and hematuria.  Musculoskeletal: Negative for neck pain.  Skin: Negative for rash.  Neurological: Negative  for headaches.    Physical Exam Updated Vital Signs BP (!) 150/78 (BP Location: Right Arm)   Pulse 80   Temp 98.4 F (36.9 C) (Oral)   Resp 18   SpO2 100%   Physical Exam Vitals and nursing note reviewed.  Constitutional:      Appearance: He is well-developed.  HENT:     Head: Normocephalic and atraumatic.  Eyes:     Conjunctiva/sclera: Conjunctivae normal.  Cardiovascular:     Rate and Rhythm: Normal rate and regular rhythm.     Heart sounds: No murmur.  Pulmonary:     Effort: Pulmonary effort is normal. No respiratory  distress.     Breath sounds: Normal breath sounds.  Abdominal:     Palpations: Abdomen is soft.     Tenderness: There is no abdominal tenderness. There is no guarding or rebound.     Hernia: No hernia is present.  Genitourinary:    Penis: Normal.      Testes:        Right: Mass and tenderness present.        Left: Mass or tenderness not present.  Musculoskeletal:     Cervical back: Neck supple.  Skin:    General: Skin is warm and dry.     Capillary Refill: Capillary refill takes less than 2 seconds.  Neurological:     General: No focal deficit present.     Mental Status: He is alert.     ED Results / Procedures / Treatments   Labs (all labs ordered are listed, but only abnormal results are displayed) Labs Reviewed  COMPREHENSIVE METABOLIC PANEL - Abnormal; Notable for the following components:      Result Value   Glucose, Bld 113 (*)    All other components within normal limits  CBC - Abnormal; Notable for the following components:   Hemoglobin 11.5 (*)    HCT 37.8 (*)    MCV 77.8 (*)    MCH 23.7 (*)    RDW 16.9 (*)    All other components within normal limits  URINALYSIS, ROUTINE W REFLEX MICROSCOPIC - Abnormal; Notable for the following components:   Ketones, ur 5 (*)    All other components within normal limits  LIPASE, BLOOD    EKG None  Radiology CT Renal Stone Study  Result Date: 07/31/2019 CLINICAL DATA:  Flank pain on the right EXAM: CT ABDOMEN AND PELVIS WITHOUT CONTRAST TECHNIQUE: Multidetector CT imaging of the abdomen and pelvis was performed following the standard protocol without IV contrast. COMPARISON:  None. FINDINGS: Lower chest: No acute abnormality. Hepatobiliary: No focal liver abnormality is seen. No gallstones, gallbladder wall thickening, or biliary dilatation. Pancreas: Unremarkable. No pancreatic ductal dilatation or surrounding inflammatory changes. Spleen: Normal in size without focal abnormality. Adrenals/Urinary Tract: Adrenal glands are  unremarkable. Kidneys are normal, without renal calculi, focal lesion, or hydronephrosis. Bladder is unremarkable. Stomach/Bowel: Stomach is within normal limits. Appendix appears normal. No evidence of bowel wall thickening, distention, or inflammatory changes. Vascular/Lymphatic: No significant vascular findings are present. No enlarged abdominal or pelvic lymph nodes. Reproductive: Prostate is unremarkable. Other: No abdominal wall hernia or abnormality. No abdominopelvic ascites. Musculoskeletal: No acute or significant osseous findings. IMPRESSION: No evidence of renal calculi or obstructive change. Normal-appearing appendix. No acute abnormality noted. Electronically Signed   By: Alcide Clever M.D.   On: 07/31/2019 20:44   US SCROTUM W/DOPPLER  Result Date: 07/31/2019 CLINICAL DATA:  Right testicular pain for 1 day EXAM: SCROTAL ULTRASOUND DOPPLER  ULTRASOUND OF THE TESTICLES TECHNIQUE: Complete ultrasound examination of the testicles, epididymis, and other scrotal structures was performed. Color and spectral Doppler ultrasound were also utilized to evaluate blood flow to the testicles. COMPARISON:  None. FINDINGS: Right testicle Measurements: 4.2 by 2.6 by 2.8 cm (volume = 16 cm^3). No mass or microlithiasis visualized. Very minimal heterogeneity along 1 pole of the testis adjacent to the epididymis for example on image 10. Left testicle Measurements: 3.6 by 2.1 by 2.7 cm (volume = 11 cm^3). No mass or microlithiasis visualized. Right epididymis: Asymmetrically enlarged with hypervascularity as shown for example on image 20 of the first series Left epididymis:  Normal in size and appearance. Hydrocele:  Small hydroceles, right greater than left. Varicocele:  None visualized. Pulsed Doppler interrogation of both testes demonstrates normal low resistance arterial and venous waveforms bilaterally. IMPRESSION: 1. Right epididymal enlargement and hypervascularity compatible with epididymitis. There is also  mildly asymmetric prominence of the right testis as well as some faint heterogeneity along one of the poles of the testis, raising the possibility of early orchitis. No discrete testicular mass. 2. Small bilateral hydroceles, right greater than left. Electronically Signed   By: Van Clines M.D.   On: 07/31/2019 20:31    Procedures Procedures (including critical care time)  Medications Ordered in ED Medications  sodium chloride flush (NS) 0.9 % injection 3 mL (has no administration in time range)    ED Course  I have reviewed the triage vital signs and the nursing notes.  Pertinent labs & imaging results that were available during my care of the patient were reviewed by me and considered in my medical decision making (see chart for details).    MDM Rules/Calculators/A&P                     This patient complains of right lower quadrant pain and right testicle pain; this involves an extensive number of treatment Options and is a complaint that carries with it a high risk of complications and Morbidity. The differential includes appendicitis, renal colic, epididymitis, testicular torsion  I ordered, reviewed and interpreted labs, which included CBC which shows a normal white count, mild anemia, chemistry showing normal chemistries renal function and LFTs.  Urinalysis with some ketones but no signs of infection.  No blood. I ordered imaging studies which included CT KUB and scrotal ultrasound and I independently    visualized and interpreted imaging which showed no gross kidney stones, normal blood flow After the interventions stated above, I reevaluated the patient and found the patient be resting comfortably.  We discussed the findings of his lab work and imaging.  Recommended urology follow-up and return instructions discussed.  For his age group would consideration for ceftriaxone and doxy.  Patient states that he is not sexually active so we will treat with Cipro.   Final  Clinical Impression(s) / ED Diagnoses Final diagnoses:  Epididymitis    Rx / DC Orders ED Discharge Orders         Ordered    ciprofloxacin (CIPRO) 500 MG tablet  2 times daily     07/31/19 2104           Hayden Rasmussen, MD 08/01/19 1250    Hayden Rasmussen, MD 08/01/19 1252

## 2019-07-31 NOTE — Discharge Instructions (Addendum)
You were seen in the emergency department for pain in your right lower quadrant of your abdomen along with right testicle.  You had blood work urinalysis CAT scan of your abdomen and pelvis and a scrotal ultrasound.  The most important finding was that you have some swelling of your epididymis which is part of your testicle.  This will usually improve with elevation and ibuprofen.  We are also putting you on an antibiotic for 1 week.  If your symptoms are not improving please follow-up with urology.  Return to the emergency department if any worsening or concerning symptoms

## 2019-07-31 NOTE — ED Triage Notes (Signed)
Patient here from home with complaints of right lower abd pain that started this afternoon. Nausea no vomiting. Reports that pain is constant.

## 2019-07-31 NOTE — ED Notes (Signed)
Patient back from CT.

## 2019-07-31 NOTE — ED Notes (Signed)
MD at bedside. 

## 2019-10-02 ENCOUNTER — Other Ambulatory Visit: Payer: Self-pay

## 2019-10-02 ENCOUNTER — Ambulatory Visit: Payer: BC Managed Care – PPO | Admitting: Family Medicine

## 2019-10-02 VITALS — BP 118/84 | HR 86 | Temp 98.1°F | Ht 69.0 in | Wt 228.0 lb

## 2019-10-02 DIAGNOSIS — L304 Erythema intertrigo: Secondary | ICD-10-CM | POA: Diagnosis not present

## 2019-10-02 DIAGNOSIS — L309 Dermatitis, unspecified: Secondary | ICD-10-CM | POA: Diagnosis not present

## 2019-10-02 MED ORDER — FLUCONAZOLE 150 MG PO TABS: 150.0000 mg | ORAL_TABLET | ORAL | 0 refills | Status: DC

## 2019-10-02 MED ORDER — MOMETASONE FUROATE 0.1 % EX CREA: 1.0000 "application " | TOPICAL_CREAM | Freq: Every day | CUTANEOUS | 0 refills | Status: DC

## 2019-10-02 NOTE — Progress Notes (Signed)
Subjective:    Patient ID: Joseph Whitehead, male    DOB: 1995/12/11, 23 y.o.   MRN: [id number]  HPI  Patient has a history of eczema.  He also has a history of recurrent intertrigo.  He has seen dermatology who is recommended using Lotrisone twice daily on his scrotum for the rash.  He has been doing this twice a day every day however he still has an erythematous papular rash on either side of his scrotum and also on the adjacent intertriginous skin on his medial thighs.  This is itching.  On examination today there is no rash on the penis.  There is no rash on the suprapubic area.  The rash is confined to the skin on the side of the scrotum and also on the adjacent skin on the thigh.  The rash is violaceous papular plaque similar to lichen planus but only confined to the intertriginous area.  It is very itchy.  He denies any dysuria.  In the past he has tried Lotrisone.  He is tried nystatin.  He has tried [place].  Nothing has worked.  He would like a second opinion with a different dermatologist. Past Medical History:  Diagnosis Date  . GERD (gastroesophageal reflux disease)   . Reflux    Past Surgical History:  Procedure Laterality Date  . WISDOM TOOTH EXTRACTION     Current Outpatient Medications on File Prior to Visit  Medication Sig Dispense Refill  . fluticasone (CUTIVATE) 0.05 % cream Apply topically 2 (two) times daily.    . ciprofloxacin (CIPRO) 500 MG tablet Take 1 tablet (500 mg total) by mouth 2 (two) times daily. (Patient not taking: Reported on 10/02/2019) 14 tablet 0  . Colloidal Oatmeal (ECZEMA MOISTURIZING EX) Apply 1 application topically in the morning and at bedtime. (Patient not taking: Reported on 10/02/2019)    . Crisaborole (EUCRISA) 2 % OINT Apply 1 application topically daily. (Patient not taking: Reported on 07/31/2019) 60 g 11  . nystatin cream (MYCOSTATIN) Apply 1 application topically 2 (two) times daily. (Patient not taking: Reported on 07/31/2019) 30 g 0  .  triamcinolone cream (KENALOG) 0.1 % Apply 1 application topically 2 (two) times daily. (Patient not taking: Reported on 07/31/2019) 30 g 0   No current facility-administered medications on file prior to visit.   Allergies  Allergen Reactions  . Penicillins Other (See Comments)    Pt. Is unsure of reaction.  Did it involve swelling of the face/tongue/throat, SOB, or low BP? Unknown Did it involve sudden or severe rash/hives, skin peeling, or any reaction on the inside of your mouth or nose? Unknown Did you need to seek medical attention at a hospital or doctor's office? Unknown When did it last happen? Child If all above answers are "NO", may proceed with cephalosporin use.    Social History   Socioeconomic History  . Marital status: Single    Spouse name: Not on file  . Number of children: Not on file  . Years of education: Not on file  . Highest education level: Not on file  Occupational History  . Not on file  Tobacco Use  . Smoking status: Never Smoker  . Smokeless tobacco: Never Used  Substance and Sexual Activity  . Alcohol use: No  . Drug use: No  . Sexual activity: Never    Comment: works as Firefighter, at [employer]    Other Topics Concern  . Not on file  Social History Narrative  . Not on  file   Social Determinants of Health   Financial Resource Strain:   . Difficulty of Paying Living Expenses:   Food Insecurity:   . Worried About Programme researcher, broadcasting/film/video in the Last Year:   . Barista in the Last Year:   Transportation Needs:   . Freight forwarder (Medical):   Marland Kitchen Lack of Transportation (Non-Medical):   Physical Activity:   . Days of Exercise per Week:   . Minutes of Exercise per Session:   Stress:   . Feeling of Stress :   Social Connections:   . Frequency of Communication with Friends and Family:   . Frequency of Social Gatherings with Friends and Family:   . Attends Religious Services:   . Active Member of Clubs or Organizations:   .  Attends Banker Meetings:   Marland Kitchen Marital Status:   Intimate Partner Violence:   . Fear of Current or Ex-Partner:   . Emotionally Abused:   Marland Kitchen Physically Abused:   . Sexually Abused:       Review of Systems  Skin: Positive for rash.  All other systems reviewed and are negative.      Objective:   Physical Exam Vitals reviewed.  Constitutional:      Appearance: He is well-developed.  Cardiovascular:     Rate and Rhythm: Normal rate and regular rhythm.     Heart sounds: Normal heart sounds.  Pulmonary:     Effort: Pulmonary effort is normal. No respiratory distress.     Breath sounds: Normal breath sounds. No wheezing or rales.  Skin:    Findings: Erythema and rash present.                Assessment & Plan:  Eczema, unspecified type  Intertrigo  I explained to the patient that there is no cure for eczema.  Also explained that certain environmental factors can cause eczema to flare up.  I believe that his eczema is likely exacerbated by intertrigo and jock itch.  I explained that he needs to keep that area as dry as he can.  Therefore I recommended that he use Goldbond powder or topical athlete's foot powder on a daily basis in the intertriginous spaces between his scrotum and thigh to act as a desiccant to keep that area dry.  He can then apply Elocon to that area for the itching.  I will temporarily treat the patient with Diflucan 150 mg p.o. weekly for 3 weeks to calm down the topical yeast infection that is in the area.  I will arrange a second opinion with dermatology to see if they have any other maintenance strategies rather than the daily application of high potency corticosteroid creams

## 2019-10-08 ENCOUNTER — Telehealth: Payer: Self-pay | Admitting: *Deleted

## 2019-10-08 NOTE — Telephone Encounter (Signed)
Patient calling to inquire about referral.

## 2019-10-09 ENCOUNTER — Ambulatory Visit (INDEPENDENT_AMBULATORY_CARE_PROVIDER_SITE_OTHER): Payer: BC Managed Care – PPO | Admitting: Family Medicine

## 2019-10-09 ENCOUNTER — Encounter: Payer: Self-pay | Admitting: Family Medicine

## 2019-10-09 ENCOUNTER — Other Ambulatory Visit: Payer: Self-pay

## 2019-10-09 VITALS — BP 124/72 | HR 70 | Temp 98.3°F | Resp 16 | Ht 69.0 in | Wt 227.0 lb

## 2019-10-09 DIAGNOSIS — R350 Frequency of micturition: Secondary | ICD-10-CM | POA: Diagnosis not present

## 2019-10-09 LAB — URINALYSIS, ROUTINE W REFLEX MICROSCOPIC
Bacteria, UA: NONE SEEN /HPF
Bilirubin Urine: NEGATIVE
Glucose, UA: NEGATIVE
Hyaline Cast: NONE SEEN /LPF
Ketones, ur: NEGATIVE
Leukocytes,Ua: NEGATIVE
Nitrite: NEGATIVE
Protein, ur: NEGATIVE
Specific Gravity, Urine: 1.02 (ref 1.001–1.03)
Squamous Epithelial / LPF: NONE SEEN /HPF (ref <=5)
WBC, UA: NONE SEEN /HPF (ref 0–5)
pH: 7 (ref 5.0–8.0)

## 2019-10-09 LAB — MICROSCOPIC MESSAGE

## 2019-10-09 MED ORDER — CIPROFLOXACIN HCL 500 MG PO TABS: 500.0000 mg | ORAL_TABLET | Freq: Two times a day (BID) | ORAL | 0 refills | Status: DC

## 2019-10-09 NOTE — Progress Notes (Signed)
Subjective:    Patient ID: Joseph Whitehead, male    DOB: 1996/02/05, 24 y.o.   MRN: [id number] Patient was seen in the emergency room May 11 of this year for right-sided abdominal pain and dysuria.  He had a CT scan of the kidneys to evaluate for a kidney stone.  There was no evidence of any nephrolithiasis.  He ultimately underwent an ultrasound of the scrotum which did show right-sided epididymitis.  He was treated with Cipro for 7 days and his symptoms resolved.  He presents today with a 24-hour history of similar pain.  He reports the pain in his right flank and in his right lower quadrant that radiates into his right testicle.  He also reports increased urinary frequency and urgency.  He denies any dysuria.  He also reports hesitancy.  Urinalysis today shows trace blood but is otherwise normal.  Physical exam today shows no CVA tenderness.  His abdomen is soft, nondistended, nontender with no guarding or rebound in the right lower quadrant.  Testicular exam is normal.  He has no real tenderness or swelling in the epididymis.  There is no lymphadenopathy in the right inguinal canal.  There is no evidence of cellulitis or rash other than his chronic eczema  Past Medical History:  Diagnosis Date  . GERD (gastroesophageal reflux disease)   . Reflux    Past Surgical History:  Procedure Laterality Date  . WISDOM TOOTH EXTRACTION     Current Outpatient Medications on File Prior to Visit  Medication Sig Dispense Refill  . fluticasone (CUTIVATE) 0.05 % cream Apply topically 2 (two) times daily.    . mometasone (ELOCON) 0.1 % cream Apply 1 application topically daily. 45 g 0  . nystatin cream (MYCOSTATIN) Apply 1 application topically 2 (two) times daily. 30 g 0  . triamcinolone cream (KENALOG) 0.1 % Apply 1 application topically 2 (two) times daily. 30 g 0   No current facility-administered medications on file prior to visit.   Allergies  Allergen Reactions  . Penicillins Other (See  Comments)    Pt. Is unsure of reaction.  Did it involve swelling of the face/tongue/throat, SOB, or low BP? Unknown Did it involve sudden or severe rash/hives, skin peeling, or any reaction on the inside of your mouth or nose? Unknown Did you need to seek medical attention at a hospital or doctor's office? Unknown When did it last happen? Child If all above answers are "NO", may proceed with cephalosporin use.    Social History   Socioeconomic History  . Marital status: Single    Spouse name: Not on file  . Number of children: Not on file  . Years of education: Not on file  . Highest education level: Not on file  Occupational History  . Not on file  Tobacco Use  . Smoking status: Never Smoker  . Smokeless tobacco: Never Used  Substance and Sexual Activity  . Alcohol use: No  . Drug use: No  . Sexual activity: Never    Comment: works as Firefighter, at [employer]    Other Topics Concern  . Not on file  Social History Narrative  . Not on file   Social Determinants of Health   Financial Resource Strain:   . Difficulty of Paying Living Expenses:   Food Insecurity:   . Worried About Programme researcher, broadcasting/film/video in the Last Year:   . Barista in the Last Year:   Transportation Needs:   . Lack  of Transportation (Medical):   Marland Kitchen Lack of Transportation (Non-Medical):   Physical Activity:   . Days of Exercise per Week:   . Minutes of Exercise per Session:   Stress:   . Feeling of Stress :   Social Connections:   . Frequency of Communication with Friends and Family:   . Frequency of Social Gatherings with Friends and Family:   . Attends Religious Services:   . Active Member of Clubs or Organizations:   . Attends Banker Meetings:   Marland Kitchen Marital Status:   Intimate Partner Violence:   . Fear of Current or Ex-Partner:   . Emotionally Abused:   Marland Kitchen Physically Abused:   . Sexually Abused:       Review of Systems  Skin: Positive for rash.  All other systems  reviewed and are negative.      Objective:   Physical Exam Vitals reviewed.  Constitutional:      Appearance: He is well-developed.  Cardiovascular:     Rate and Rhythm: Normal rate and regular rhythm.     Heart sounds: Normal heart sounds.  Pulmonary:     Effort: Pulmonary effort is normal. No respiratory distress.     Breath sounds: Normal breath sounds. No wheezing or rales.  Abdominal:     Hernia: There is no hernia in the left inguinal area or right inguinal area.  Genitourinary:    Penis: Normal.      Testes: Normal.        Right: Mass not present.        Left: Mass not present.     Epididymis:     Right: No tenderness.     Left: No tenderness.  Lymphadenopathy:     Lower Body: No right inguinal adenopathy. No left inguinal adenopathy.  Skin:    Findings: Rash present. No erythema.                Assessment & Plan:  Urinary frequency - Plan: Urinalysis, Routine w reflex microscopic Urinalysis shows blood.  Symptoms will be more consistent with the urinary tract infection however his urinalysis does not suggest that.  I was also concerned about a kidney stone however he had a recent CT scan that showed no nephrolithiasis.  Patient states that he had the exact same symptoms when he had epididymitis in May.  He states that he felt the exact same way.  Therefore I will treat the patient empirically with Cipro 500 mg p.o. twice daily for 7 days and reassess in 48 hours if no better or sooner if worsening.  His scrotal exam today is benign however.  Patient states that he is a virgin and therefore gonorrhea or chlamydia is not on the differential diagnosis

## 2019-10-09 NOTE — Addendum Note (Signed)
Addended by: Phillips Odor on: 10/09/2019 02:03 PM   Modules accepted: Orders

## 2019-10-10 LAB — URINE CULTURE
MICRO NUMBER:: 10726572
Result:: NO GROWTH
SPECIMEN QUALITY:: ADEQUATE

## 2019-10-19 ENCOUNTER — Ambulatory Visit: Payer: BC Managed Care – PPO | Admitting: Family Medicine

## 2019-10-26 NOTE — Telephone Encounter (Signed)
I called patient and he states he has went and seen Dr. Margo Aye but wasn't satisfied with him. He states he has found another doctor and he will call me back on Monday to discuss he is at work.

## 2019-11-27 ENCOUNTER — Ambulatory Visit: Payer: BC Managed Care – PPO | Admitting: Family Medicine

## 2019-11-27 ENCOUNTER — Other Ambulatory Visit: Payer: Self-pay

## 2019-11-27 ENCOUNTER — Encounter: Payer: Self-pay | Admitting: Family Medicine

## 2019-11-27 VITALS — BP 124/88 | HR 109 | Temp 97.0°F | Ht 69.0 in | Wt 219.0 lb

## 2019-11-27 DIAGNOSIS — R1084 Generalized abdominal pain: Secondary | ICD-10-CM

## 2019-11-27 MED ORDER — MAGNESIUM CITRATE PO SOLN: 1.0000 | Freq: Once | ORAL | 0 refills | Status: AC

## 2019-11-27 NOTE — Progress Notes (Signed)
Subjective:    Patient ID: Joseph Whitehead, male    DOB: 06-26-1995, 24 y.o.   MRN: [id number]  7/21 Patient was seen in the emergency room May 11 of this year for right-sided abdominal pain and dysuria.  He had a CT scan of the kidneys to evaluate for a kidney stone.  There was no evidence of any nephrolithiasis.  He ultimately underwent an ultrasound of the scrotum which did show right-sided epididymitis.  He was treated with Cipro for 7 days and his symptoms resolved.  He presents today with a 24-hour history of similar pain.  He reports the pain in his right flank and in his right lower quadrant that radiates into his right testicle.  He also reports increased urinary frequency and urgency.  He denies any dysuria.  He also reports hesitancy.  Urinalysis today shows trace blood but is otherwise normal.  Physical exam today shows no CVA tenderness.  His abdomen is soft, nondistended, nontender with no guarding or rebound in the right lower quadrant.  Testicular exam is normal.  He has no real tenderness or swelling in the epididymis.  There is no lymphadenopathy in the right inguinal canal.  There is no evidence of cellulitis or rash other than his chronic eczema.  At that time, my plan was: Urinalysis shows blood.  Symptoms will be more consistent with the urinary tract infection however his urinalysis does not suggest that.  I was also concerned about a kidney stone however he had a recent CT scan that showed no nephrolithiasis.  Patient states that he had the exact same symptoms when he had epididymitis in May.  He states that he felt the exact same way.  Therefore I will treat the patient empirically with Cipro 500 mg p.o. twice daily for 7 days and reassess in 48 hours if no better or sooner if worsening.  His scrotal exam today is benign however.  Patient states that he is a virgin and therefore gonorrhea or chlamydia is not on the differential diagnosis  11/27/19 Patient presents today with right  flank pain. He states that he has not had a bowel movement in several days. The pain is made worse by not having a bowel movement. If he passes gas or has a bowel movement, the pain improves. He states that over the last 3 days he has had 3 very small bowel movements. This is not common for him. Usually he goes 2 or 3 times a day. He denies any melena or hematochezia. He denies any nausea or vomiting. He denies any hematuria or dysuria. He has no CVA tenderness today on exam. He has no cough or shortness of breath. Pain is not made worse by movement. He can twist his back and bend over with no pain. He has no rash in that area. He is tender to palpation in the epigastric area as well as in the lower abdomen.  Past Medical History:  Diagnosis Date  . GERD (gastroesophageal reflux disease)   . Reflux    Past Surgical History:  Procedure Laterality Date  . WISDOM TOOTH EXTRACTION     Current Outpatient Medications on File Prior to Visit  Medication Sig Dispense Refill  . ciprofloxacin (CIPRO) 500 MG tablet Take 1 tablet (500 mg total) by mouth 2 (two) times daily. 14 tablet 0  . fluticasone (CUTIVATE) 0.05 % cream Apply topically 2 (two) times daily.    . mometasone (ELOCON) 0.1 % cream Apply 1 application topically daily. 45  g 0  . nystatin cream (MYCOSTATIN) Apply 1 application topically 2 (two) times daily. 30 g 0  . triamcinolone cream (KENALOG) 0.1 % Apply 1 application topically 2 (two) times daily. 30 g 0   No current facility-administered medications on file prior to visit.   Allergies  Allergen Reactions  . Penicillins Other (See Comments)    Pt. Is unsure of reaction.  Did it involve swelling of the face/tongue/throat, SOB, or low BP? Unknown Did it involve sudden or severe rash/hives, skin peeling, or any reaction on the inside of your mouth or nose? Unknown Did you need to seek medical attention at a hospital or doctor's office? Unknown When did it last happen? Child If  all above answers are "NO", may proceed with cephalosporin use.    Social History   Socioeconomic History  . Marital status: Single    Spouse name: Not on file  . Number of children: Not on file  . Years of education: Not on file  . Highest education level: Not on file  Occupational History  . Not on file  Tobacco Use  . Smoking status: Never Smoker  . Smokeless tobacco: Never Used  Substance and Sexual Activity  . Alcohol use: No  . Drug use: No  . Sexual activity: Never    Comment: works as Firefighter, at [employer]    Other Topics Concern  . Not on file  Social History Narrative  . Not on file   Social Determinants of Health   Financial Resource Strain:   . Difficulty of Paying Living Expenses: Not on file  Food Insecurity:   . Worried About Programme researcher, broadcasting/film/video in the Last Year: Not on file  . Ran Out of Food in the Last Year: Not on file  Transportation Needs:   . Lack of Transportation (Medical): Not on file  . Lack of Transportation (Non-Medical): Not on file  Physical Activity:   . Days of Exercise per Week: Not on file  . Minutes of Exercise per Session: Not on file  Stress:   . Feeling of Stress : Not on file  Social Connections:   . Frequency of Communication with Friends and Family: Not on file  . Frequency of Social Gatherings with Friends and Family: Not on file  . Attends Religious Services: Not on file  . Active Member of Clubs or Organizations: Not on file  . Attends Banker Meetings: Not on file  . Marital Status: Not on file  Intimate Partner Violence:   . Fear of Current or Ex-Partner: Not on file  . Emotionally Abused: Not on file  . Physically Abused: Not on file  . Sexually Abused: Not on file      Review of Systems  Skin: Positive for rash.  All other systems reviewed and are negative.      Objective:   Physical Exam Vitals reviewed.  Constitutional:      Appearance: He is well-developed.  Cardiovascular:     Rate  and Rhythm: Normal rate and regular rhythm.     Heart sounds: Normal heart sounds.  Pulmonary:     Effort: Pulmonary effort is normal. No respiratory distress.     Breath sounds: Normal breath sounds. No wheezing or rales.  Abdominal:     General: Abdomen is flat. Bowel sounds are decreased.     Palpations: Abdomen is soft. There is no hepatomegaly or mass.     Tenderness: There is generalized abdominal tenderness. There is  no right CVA tenderness, left CVA tenderness, guarding or rebound. Negative signs include Murphy's sign, McBurney's sign and obturator sign.     Hernia: No hernia is present. There is no hernia in the left inguinal area or right inguinal area.    Genitourinary:    Epididymis:     Right: No tenderness.     Left: No tenderness.  Lymphadenopathy:     Lower Body: No right inguinal adenopathy. No left inguinal adenopathy.           Assessment & Plan:  Generalized abdominal pain  Patient has colicky right-sided abdominal pain. The pain is located where the circle was drawn on the diagram except posteriorly. I do not believe this is gallbladder or kidney stone. Instead I believe this is intestinal spasms brought on by constipation. He has no evidence of an acute abdomen. He denies any urinary symptoms. Therefore we will treat the patient for constipation with magnesium citrate 300 mL p.o. x1 and then reassess in 48 hours to see if pain is improving. Recheck immediately if worse

## 2020-01-04 ENCOUNTER — Ambulatory Visit: Payer: BC Managed Care – PPO | Admitting: Family Medicine

## 2020-01-04 ENCOUNTER — Other Ambulatory Visit: Payer: Self-pay

## 2020-01-04 VITALS — BP 130/80 | HR 97 | Temp 98.2°F | Ht 69.0 in | Wt 214.0 lb

## 2020-01-04 DIAGNOSIS — R319 Hematuria, unspecified: Secondary | ICD-10-CM | POA: Diagnosis not present

## 2020-01-04 LAB — URINALYSIS, ROUTINE W REFLEX MICROSCOPIC
Bacteria, UA: NONE SEEN /HPF
Bilirubin Urine: NEGATIVE
Glucose, UA: NEGATIVE
Hyaline Cast: NONE SEEN /LPF
Leukocytes,Ua: NEGATIVE
Nitrite: NEGATIVE
Protein, ur: NEGATIVE
Specific Gravity, Urine: 1.025 (ref 1.001–1.03)
Squamous Epithelial / LPF: NONE SEEN /HPF (ref <=5)
WBC, UA: NONE SEEN /HPF (ref 0–5)
pH: 6.5 (ref 5.0–8.0)

## 2020-01-04 LAB — MICROSCOPIC MESSAGE

## 2020-01-04 NOTE — Patient Instructions (Signed)
° ° ° °  If you have lab work done today you will be contacted with your lab results within the next 2 weeks.  If you have not heard from us then please contact us. The fastest way to get your results is to register for My Chart. ° ° °IF you received an x-ray today, you will receive an invoice from [facility]. Please contact [facility] at [phone] with questions or concerns regarding your invoice.  ° °IF you received labwork today, you will receive an invoice from [facility]. Please contact [facility] at [phone] with questions or concerns regarding your invoice.  ° °Our billing staff will not be able to assist you with questions regarding bills from these companies. ° °You will be contacted with the lab results as soon as they are available. The fastest way to get your results is to activate your My Chart account. Instructions are located on the last page of this paperwork. If you have not heard from us regarding the results in 2 weeks, please contact this office. °  ° ° ° °

## 2020-01-04 NOTE — Progress Notes (Signed)
Subjective:    Patient ID: Joseph Whitehead, male    DOB: [birth year], 24 y.o.   MRN: [id number]  7/21 Patient was seen in the emergency room May 11 of this year for right-sided abdominal pain and dysuria.  He had a CT scan of the kidneys to evaluate for a kidney stone.  There was no evidence of any nephrolithiasis.  He ultimately underwent an ultrasound of the scrotum which did show right-sided epididymitis.  He was treated with Cipro for 7 days and his symptoms resolved.  He presents today with a 24-hour history of similar pain.  He reports the pain in his right flank and in his right lower quadrant that radiates into his right testicle.  He also reports increased urinary frequency and urgency.  He denies any dysuria.  He also reports hesitancy.  Urinalysis today shows trace blood but is otherwise normal.  Physical exam today shows no CVA tenderness.  His abdomen is soft, nondistended, nontender with no guarding or rebound in the right lower quadrant.  Testicular exam is normal.  He has no real tenderness or swelling in the epididymis.  There is no lymphadenopathy in the right inguinal canal.  There is no evidence of cellulitis or rash other than his chronic eczema.  At that time, my plan was: Urinalysis shows blood.  Symptoms will be more consistent with the urinary tract infection however his urinalysis does not suggest that.  I was also concerned about a kidney stone however he had a recent CT scan that showed no nephrolithiasis.  Patient states that he had the exact same symptoms when he had epididymitis in May.  He states that he felt the exact same way.  Therefore I will treat the patient empirically with Cipro 500 mg p.o. twice daily for 7 days and reassess in 48 hours if no better or sooner if worsening.  His scrotal exam today is benign however.  Patient states that he is a virgin and therefore gonorrhea or chlamydia is not on the differential diagnosis  11/27/19 Patient presents today with right  flank pain. He states that he has not had a bowel movement in several days. The pain is made worse by not having a bowel movement. If he passes gas or has a bowel movement, the pain improves. He states that over the last 3 days he has had 3 very small bowel movements. This is not common for him. Usually he goes 2 or 3 times a day. He denies any melena or hematochezia. He denies any nausea or vomiting. He denies any hematuria or dysuria. He has no CVA tenderness today on exam. He has no cough or shortness of breath. Pain is not made worse by movement. He can twist his back and bend over with no pain. He has no rash in that area. He is tender to palpation in the epigastric area as well as in the lower abdomen.  At that time, my plan was: Patient has colicky right-sided abdominal pain. The pain is located where the circle was drawn on the diagram except posteriorly. I do not believe this is gallbladder or kidney stone. Instead I believe this is intestinal spasms brought on by constipation. He has no evidence of an acute abdomen. He denies any urinary symptoms. Therefore we will treat the patient for constipation with magnesium citrate 300 mL p.o. x1 and then reassess in 48 hours to see if pain is improving. Recheck immediately if worse  01/04/20 Patient states that he went  to an urgent care about 2 weeks ago.  He states that he was again having pain in his right upper quadrant.  The pain would occur after he would work all day long.  He states that the pain was sharp but it made him feel like he needed to drink more water.  He thought the pain was coming from dehydration.  However after having the pain off and on for 2 or 3 weeks, the patient ultimately went to an urgent care.  At the urgent care his lab work was reportedly normal however a urinalysis revealed blood.  He is here today at their recommendation to recheck his urinalysis.  It has now been 2 weeks since he went to the urgent care and today on his urine  test there is +1 blood negative protein negative nitrites and negative leukocyte esterase.  Of note, the patient does a physical job.  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  Past Medical History:  Diagnosis Date  . GERD (gastroesophageal reflux disease)   . Reflux    Past Surgical History:  Procedure Laterality Date  . WISDOM TOOTH EXTRACTION     Current Outpatient Medications on File Prior to Visit  Medication Sig Dispense Refill  . fluticasone (CUTIVATE) 0.05 % cream Apply topically 2 (two) times daily.    . mometasone (ELOCON) 0.1 % cream Apply 1 application topically daily. 45 g 0  . nystatin cream (MYCOSTATIN) Apply 1 application topically 2 (two) times daily. 30 g 0  . triamcinolone cream (KENALOG) 0.1 % Apply 1 application topically 2 (two) times daily. 30 g 0  . ciprofloxacin (CIPRO) 500 MG tablet Take 1 tablet (500 mg total) by mouth 2 (two) times daily. (Patient not taking: Reported on 11/27/2019) 14 tablet 0   No current facility-administered medications on file prior to visit.   Allergies  Allergen Reactions  . Penicillins Other (See Comments)    Pt. Is unsure of reaction.  Did it involve swelling of the face/tongue/throat, SOB, or low BP? Unknown Did it involve sudden or severe rash/hives, skin peeling, or any reaction on the inside of your mouth or nose? Unknown Did you need to seek medical attention at a hospital or doctor's office? Unknown When did it last happen? Child If all above answers are "NO", may proceed with cephalosporin use.    Social History   Socioeconomic History  . Marital status: Single    Spouse name: Not on file  . Number of children: Not on file  . Years of education: Not on file  . Highest education level: Not on file  Occupational History  . Not on file  Tobacco Use  . Smoking status: Never Smoker  . Smokeless tobacco: Never Used  Substance and Sexual Activity  . Alcohol use: No  . Drug use: No   . Sexual activity: Never    Comment: works as Firefighter, at [employer]    Other Topics Concern  . Not on file  Social History Narrative  . Not on file   Social Determinants of Health   Financial Resource Strain:   . Difficulty of Paying Living Expenses: Not on file  Food Insecurity:   . Worried About Programme researcher, broadcasting/film/video in the Last Year: Not on file  . Ran Out of Food in the Last Year: Not on file  Transportation Needs:   . Lack of Transportation (Medical): Not on file  . Lack of Transportation (  Non-Medical): Not on file  Physical Activity:   . Days of Exercise per Week: Not on file  . Minutes of Exercise per Session: Not on file  Stress:   . Feeling of Stress : Not on file  Social Connections:   . Frequency of Communication with Friends and Family: Not on file  . Frequency of Social Gatherings with Friends and Family: Not on file  . Attends Religious Services: Not on file  . Active Member of Clubs or Organizations: Not on file  . Attends Banker Meetings: Not on file  . Marital Status: Not on file  Intimate Partner Violence:   . Fear of Current or Ex-Partner: Not on file  . Emotionally Abused: Not on file  . Physically Abused: Not on file  . Sexually Abused: Not on file      Review of Systems  Skin: Positive for rash.  All other systems reviewed and are negative.      Objective:   Physical Exam Vitals reviewed.  Constitutional:      Appearance: He is well-developed.  Cardiovascular:     Rate and Rhythm: Normal rate and regular rhythm.     Heart sounds: Normal heart sounds.  Pulmonary:     Effort: Pulmonary effort is normal. No respiratory distress.     Breath sounds: Normal breath sounds. No wheezing or rales.  Abdominal:     General: Abdomen is flat. Bowel sounds are normal.     Palpations: Abdomen is soft. There is no hepatomegaly or mass.     Tenderness: There is no abdominal tenderness. There is no right CVA tenderness, left CVA tenderness,  guarding or rebound. Negative signs include Murphy's sign, McBurney's sign and obturator sign.     Hernia: No hernia is present. There is no hernia in the left inguinal area or right inguinal area.  Genitourinary:    Epididymis:     Right: No tenderness.     Left: No tenderness.  Lymphadenopathy:     Lower Body: No right inguinal adenopathy. No left inguinal adenopathy.           Assessment & Plan:  Hematuria, unspecified type - Plan: Urinalysis, Routine w reflex microscopic  Patient has been pain-free now for 2 weeks.  He is no longer having any right upper quadrant pain.  I do not believe that the right upper quadrant pain was related to the hematuria found on his urine sample.  He had a negative CT scan in May and I do not believe that a new kidney stone would have formed since May.  I believe that the right upper quadrant pain could either be constipation or gallstones or GI related however he has been pain-free now for 2 weeks and so we will defer further work-up.  Patient is very concerned with white blood is in his urine.  He has had a negative CT scan.  He is at low risk for any type of bladder lesion however the next step would be a consultation with a urologist.  Patient would like to see a urologist for a second opinion.  I believe the blood could potentially be due to heavy lifting.

## 2020-01-28 NOTE — Progress Notes (Signed)
Subjective: 1. Asymptomatic microscopic hematuria   2. Microhematuria   3. Testicular atrophy   4. Microcytic anemia      Joseph Whitehead is a 24 yo male who is sent in consultation by Dr. Tanya Nones for hematuria.   He had a CT stone study in May for right flank pain that was negative for stones or other GU findings. .  His UA on 01/04/20 had 3-10 RBC's.  He had a UA in 7/19 with 11-20 RBC's.  His Cr was 0.9 on 07/31/19.  He had possible right epididymitis that was treated in May.  He has had no gross hematuria.  He had some nocturia about a month or two ago but that has resolved.  He was given Cipro in May and in July.  He had a urine culture in July that was negative.   His UA today is clear.  He is a non-smoker.  He has had no family history of hematuria.  He has a chronic mild anemia.  ROS:  ROS  Allergies  Allergen Reactions  . Penicillins Other (See Comments)    Pt. Is unsure of reaction.  Did it involve swelling of the face/tongue/throat, SOB, or low BP? Unknown Did it involve sudden or severe rash/hives, skin peeling, or any reaction on the inside of your mouth or nose? Unknown Did you need to seek medical attention at a hospital or doctor's office? Unknown When did it last happen? Child If all above answers are "NO", may proceed with cephalosporin use.     Past Medical History:  Diagnosis Date  . GERD (gastroesophageal reflux disease)   . Reflux     Past Surgical History:  Procedure Laterality Date  . WISDOM TOOTH EXTRACTION      Social History   Socioeconomic History  . Marital status: Single    Spouse name: Not on file  . Number of children: Not on file  . Years of education: Not on file  . Highest education level: Not on file  Occupational History  . Occupation: housekeeper/front desk  Tobacco Use  . Smoking status: Never Smoker  . Smokeless tobacco: Never Used  Substance and Sexual Activity  . Alcohol use: No  . Drug use: No  . Sexual activity: Never     Comment: works as Firefighter, at [employer]    Other Topics Concern  . Not on file  Social History Narrative  . Not on file   Social Determinants of Health   Financial Resource Strain:   . Difficulty of Paying Living Expenses: Not on file  Food Insecurity:   . Worried About Programme researcher, broadcasting/film/video in the Last Year: Not on file  . Ran Out of Food in the Last Year: Not on file  Transportation Needs:   . Lack of Transportation (Medical): Not on file  . Lack of Transportation (Non-Medical): Not on file  Physical Activity:   . Days of Exercise per Week: Not on file  . Minutes of Exercise per Session: Not on file  Stress:   . Feeling of Stress : Not on file  Social Connections:   . Frequency of Communication with Friends and Family: Not on file  . Frequency of Social Gatherings with Friends and Family: Not on file  . Attends Religious Services: Not on file  . Active Member of Clubs or Organizations: Not on file  . Attends Banker Meetings: Not on file  . Marital Status: Not on file  Intimate Partner Violence:   .  Fear of Current or Ex-Partner: Not on file  . Emotionally Abused: Not on file  . Physically Abused: Not on file  . Sexually Abused: Not on file    Family History  Problem Relation Age of Onset  . Diabetes Mother   . Hyperlipidemia Mother   . Hypertension Mother   . Hypertension Father   . Cancer Maternal Grandmother        breast    Anti-infectives: Anti-infectives (From admission, onward)   None      Current Outpatient Medications  Medication Sig Dispense Refill  . fluticasone (CUTIVATE) 0.05 % cream Apply topically 2 (two) times daily.    Marland Kitchen nystatin cream (MYCOSTATIN) Apply 1 application topically 2 (two) times daily. 30 g 0  . triamcinolone cream (KENALOG) 0.1 % Apply 1 application topically 2 (two) times daily. 30 g 0  . mometasone (ELOCON) 0.1 % cream Apply 1 application topically daily. (Patient not taking: Reported on 01/29/2020) 45 g 0   No  current facility-administered medications for this visit.     Objective: Vital signs in last 24 hours: BP 127/74   Pulse 67   Temp 98.6 F (37 C)   Ht 5\' 9"  (1.753 m)   Wt 214 lb (97.1 kg)   BMI 31.60 kg/m   Intake/Output from previous day: No intake/output data recorded. Intake/Output this shift: @IOTHISSHIFT @   Physical Exam Vitals reviewed.  Constitutional:      Appearance: Normal appearance.  HENT:     Head: Normocephalic and atraumatic.  Cardiovascular:     Rate and Rhythm: Normal rate and regular rhythm.     Heart sounds: Normal heart sounds.     Comments: He has some gynecomastia. Pulmonary:     Effort: Pulmonary effort is normal.     Breath sounds: Normal breath sounds.  Abdominal:     General: Abdomen is flat.     Palpations: Abdomen is soft.     Tenderness: There is no abdominal tenderness.     Hernia: No hernia is present.  Genitourinary:    Comments: Normal phallus with adequate meatus. Scrotum normal. Testes with mild atrophy without mass. Epididymis normal. AP without lesions. NST without mass. Prostate 1+ smooth and firm. SV's non-palpable.  Musculoskeletal:        General: No swelling or tenderness. Normal range of motion.     Cervical back: Normal range of motion and neck supple.  Skin:    General: Skin is warm and dry.  Neurological:     General: No focal deficit present.     Mental Status: He is alert and oriented to person, place, and time.     Lab Results:  Results for orders placed or performed in visit on 01/29/20 (from the past 24 hour(s))  Urinalysis, Routine w reflex microscopic     Status: None   Collection Time: 01/29/20 11:43 AM  Result Value Ref Range   Specific Gravity, UA 1.020 1.005 - 1.030   pH, UA 7.0 5.0 - 7.5   Color, UA Yellow Yellow   Appearance Ur Clear Clear   Leukocytes,UA Negative Negative   Protein,UA Negative Negative/Trace   Glucose, UA Negative Negative   Ketones, UA Negative Negative   RBC, UA  Negative Negative   Bilirubin, UA Negative Negative   Urobilinogen, Ur 0.2 0.2 - 1.0 mg/dL   Nitrite, UA Negative Negative   Microscopic Examination Comment    Narrative   Performed at:  01 - [facility] [address] [place], [address]  [id number] Lab Director: Chinita Pester MT, Phone:  [phone]    BMET No results for input(s): NA, K, CL, CO2, GLUCOSE, BUN, CREATININE, CALCIUM in the last 72 hours. PT/INR No results for input(s): LABPROT, INR in the last 72 hours. ABG No results for input(s): PHART, HCO3 in the last 72 hours.  Invalid input(s): PCO2, PO2  Studies/Results: CT films and report reviewed. UA and cultures reviewed.    Assessment/Plan: Microhematuria that has been present for at least 2 years.   I am going to get a renal US and will have him return in 6 weeks.  I will consider cystoscopy if the renal US is unrevealing and the hematuria recurs.  BMP today.  Testicular atrophy.  I will get a testosterone level today.  Microcytic anemia.  CBC today to reassess.  No orders of the defined types were placed in this encounter.    Orders Placed This Encounter  Procedures  . US RENAL    Standing Status:   Future    Standing Expiration Date:   02/28/2020    Order Specific Question:   Reason for Exam (SYMPTOM  OR DIAGNOSIS REQUIRED)    Answer:   microhematuria    Order Specific Question:   Preferred imaging location?    Answer:   [facility]  . Urinalysis, Routine w reflex microscopic  . CBC    Standing Status:   Future    Standing Expiration Date:   02/28/2020  . Basic metabolic panel    Standing Status:   Future    Standing Expiration Date:   02/28/2020  . Testosterone    Standing Status:   Future    Standing Expiration Date:   02/28/2020  . Testosterone Free, Profile I     Return in about 6 weeks (around 03/11/2020).    CC: Lynnea Ferrier MD.      Bjorn Pippin 01/29/2020 [phone]

## 2020-01-29 ENCOUNTER — Encounter: Payer: Self-pay | Admitting: Urology

## 2020-01-29 ENCOUNTER — Other Ambulatory Visit: Payer: Self-pay | Admitting: Urology

## 2020-01-29 ENCOUNTER — Other Ambulatory Visit: Payer: Self-pay

## 2020-01-29 ENCOUNTER — Ambulatory Visit (INDEPENDENT_AMBULATORY_CARE_PROVIDER_SITE_OTHER): Payer: BC Managed Care – PPO | Admitting: Urology

## 2020-01-29 VITALS — BP 127/74 | HR 67 | Temp 98.6°F | Ht 69.0 in | Wt 214.0 lb

## 2020-01-29 DIAGNOSIS — N500 Atrophy of testis: Secondary | ICD-10-CM

## 2020-01-29 DIAGNOSIS — R3121 Asymptomatic microscopic hematuria: Secondary | ICD-10-CM

## 2020-01-29 DIAGNOSIS — D509 Iron deficiency anemia, unspecified: Secondary | ICD-10-CM

## 2020-01-29 DIAGNOSIS — R3129 Other microscopic hematuria: Secondary | ICD-10-CM

## 2020-01-29 LAB — URINALYSIS, ROUTINE W REFLEX MICROSCOPIC
Bilirubin, UA: NEGATIVE
Glucose, UA: NEGATIVE
Ketones, UA: NEGATIVE
Leukocytes,UA: NEGATIVE
Nitrite, UA: NEGATIVE
Protein,UA: NEGATIVE
RBC, UA: NEGATIVE
Specific Gravity, UA: 1.02 (ref 1.005–1.030)
Urobilinogen, Ur: 0.2 mg/dL (ref 0.2–1.0)
pH, UA: 7 (ref 5.0–7.5)

## 2020-01-29 NOTE — Progress Notes (Signed)
Urological Symptom Review  Patient is experiencing the following symptoms: Get up at night to urinate Blood in urine   Review of Systems  Gastrointestinal (upper)  : Negative for upper GI symptoms  Gastrointestinal (lower) : Negative for lower GI symptoms  Constitutional : Negative for symptoms  Skin: Negative for skin symptoms  Eyes: Negative for eye symptoms  Ear/Nose/Throat : Negative for Ear/Nose/Throat symptoms  Hematologic/Lymphatic: Negative for Hematologic/Lymphatic symptoms  Cardiovascular : Negative for cardiovascular symptoms  Respiratory : Negative for respiratory symptoms  Endocrine: Negative for endocrine symptoms  Musculoskeletal: Negative for musculoskeletal symptoms  Neurological: Negative for neurological symptoms  Psychologic: Negative for psychiatric symptoms  

## 2020-01-30 LAB — CBC
Hematocrit: 41.7 % (ref 37.5–51.0)
Hemoglobin: 13 g/dL (ref 13.0–17.7)
MCH: 24.2 pg — ABNORMAL LOW (ref 26.6–33.0)
MCHC: 31.2 g/dL — ABNORMAL LOW (ref 31.5–35.7)
MCV: 78 fL — ABNORMAL LOW (ref 79–97)
Platelets: 328 10*3/uL (ref 150–450)
RBC: 5.38 x10E6/uL (ref 4.14–5.80)
RDW: 15.8 % — ABNORMAL HIGH (ref 11.6–15.4)
WBC: 7.4 10*3/uL (ref 3.4–10.8)

## 2020-01-30 LAB — TESTOSTERONE FREE, PROFILE I
Albumin: 4.7 g/dL (ref 4.1–5.2)
Sex Hormone Binding: 23.9 nmol/L (ref 16.5–55.9)
Testost., Free, Calc: 64.1 pg/mL (ref 47.7–173.9)
Testosterone: 289 ng/dL (ref 264–916)

## 2020-01-30 LAB — BASIC METABOLIC PANEL
BUN/Creatinine Ratio: 17 (ref 9–20)
BUN: 17 mg/dL (ref 6–20)
CO2: 23 mmol/L (ref 20–29)
Calcium: 9.8 mg/dL (ref 8.7–10.2)
Chloride: 100 mmol/L (ref 96–106)
Creatinine, Ser: 1 mg/dL (ref 0.76–1.27)
GFR calc Af Amer: 120 mL/min/{1.73_m2} (ref >59)
GFR calc non Af Amer: 103 mL/min/{1.73_m2} (ref >59)
Glucose: 87 mg/dL (ref 65–99)
Potassium: 4.8 mmol/L (ref 3.5–5.2)
Sodium: 140 mmol/L (ref 134–144)

## 2020-01-30 LAB — TESTOSTERONE: Testosterone: 303 ng/dL (ref 264–916)

## 2020-02-01 ENCOUNTER — Ambulatory Visit: Payer: BC Managed Care – PPO | Admitting: Urology

## 2020-02-04 ENCOUNTER — Telehealth: Payer: Self-pay

## 2020-02-04 NOTE — Progress Notes (Signed)
His total testosterone is borderline low at 303 but he has a normal free testosterone level which is the active form.  His anemia has improved with a hemoglobin in the normal range at 13.  His chemistries are normal.  F/u as planned.

## 2020-02-04 NOTE — Telephone Encounter (Signed)
-----   Message from Bjorn Pippin, MD sent at 02/04/2020 11:43 AM EST ----- His total testosterone is borderline low at 303 but he has a normal free testosterone level which is the active form.  His anemia has improved with a hemoglobin in the normal range at 13.  His chemistries are normal.  F/u as planned.

## 2020-02-04 NOTE — Telephone Encounter (Signed)
Pt made aware of all results and of future appt also.

## 2020-02-22 ENCOUNTER — Ambulatory Visit: Payer: BC Managed Care – PPO | Admitting: Urology

## 2020-02-26 ENCOUNTER — Telehealth: Payer: Self-pay

## 2020-02-26 NOTE — Telephone Encounter (Signed)
-----   Message from Bjorn Pippin, MD sent at 02/25/2020  3:59 PM EST ----- He would need to be seen by [place] or his PCP for a UA and exam.  ----- Message ----- From: Dalia Heading, LPN Sent: 07/25/9792   3:44 PM EST To: Bjorn Pippin, MD  Pt called c/o one side of scrotum is tender to touch and increased urinary frequency again. Pls advise.

## 2020-02-26 NOTE — Telephone Encounter (Signed)
Pt made aware of appt made for 12/17.

## 2020-03-03 ENCOUNTER — Ambulatory Visit: Payer: BC Managed Care – PPO | Admitting: Family Medicine

## 2020-03-04 ENCOUNTER — Other Ambulatory Visit: Payer: Self-pay

## 2020-03-04 ENCOUNTER — Ambulatory Visit (INDEPENDENT_AMBULATORY_CARE_PROVIDER_SITE_OTHER): Payer: Self-pay | Admitting: Family Medicine

## 2020-03-04 VITALS — BP 130/80 | HR 70 | Temp 98.6°F | Ht 68.0 in | Wt 211.0 lb

## 2020-03-04 DIAGNOSIS — R350 Frequency of micturition: Secondary | ICD-10-CM

## 2020-03-04 DIAGNOSIS — N50812 Left testicular pain: Secondary | ICD-10-CM

## 2020-03-04 DIAGNOSIS — R208 Other disturbances of skin sensation: Secondary | ICD-10-CM

## 2020-03-04 LAB — URINALYSIS, ROUTINE W REFLEX MICROSCOPIC
Bacteria, UA: NONE SEEN /HPF
Bilirubin Urine: NEGATIVE
Glucose, UA: NEGATIVE
Hyaline Cast: NONE SEEN /LPF
Ketones, ur: NEGATIVE
Leukocytes,Ua: NEGATIVE
Nitrite: NEGATIVE
Protein, ur: NEGATIVE
Specific Gravity, Urine: 1.025 (ref 1.001–1.03)
Squamous Epithelial / LPF: NONE SEEN /HPF (ref <=5)
WBC, UA: NONE SEEN /HPF (ref 0–5)
pH: 6 (ref 5.0–8.0)

## 2020-03-04 LAB — MICROSCOPIC MESSAGE

## 2020-03-04 MED ORDER — DICLOFENAC SODIUM 75 MG PO TBEC: 75.0000 mg | DELAYED_RELEASE_TABLET | Freq: Two times a day (BID) | ORAL | 0 refills | Status: DC

## 2020-03-04 NOTE — Progress Notes (Signed)
Subjective:    Patient ID: Joseph Whitehead, male    DOB: 1995/06/06, 24 y.o.   MRN: [id number]  7/21 Patient was seen in the emergency room May 11 of this year for right-sided abdominal pain and dysuria.  He had a CT scan of the kidneys to evaluate for a kidney stone.  There was no evidence of any nephrolithiasis.  He ultimately underwent an ultrasound of the scrotum which did show right-sided epididymitis.  He was treated with Cipro for 7 days and his symptoms resolved.  He presents today with a 24-hour history of similar pain.  He reports the pain in his right flank and in his right lower quadrant that radiates into his right testicle.  He also reports increased urinary frequency and urgency.  He denies any dysuria.  He also reports hesitancy.  Urinalysis today shows trace blood but is otherwise normal.  Physical exam today shows no CVA tenderness.  His abdomen is soft, nondistended, nontender with no guarding or rebound in the right lower quadrant.  Testicular exam is normal.  He has no real tenderness or swelling in the epididymis.  There is no lymphadenopathy in the right inguinal canal.  There is no evidence of cellulitis or rash other than his chronic eczema.  At that time, my plan was: Urinalysis shows blood.  Symptoms will be more consistent with the urinary tract infection however his urinalysis does not suggest that.  I was also concerned about a kidney stone however he had a recent CT scan that showed no nephrolithiasis.  Patient states that he had the exact same symptoms when he had epididymitis in May.  He states that he felt the exact same way.  Therefore I will treat the patient empirically with Cipro 500 mg p.o. twice daily for 7 days and reassess in 48 hours if no better or sooner if worsening.  His scrotal exam today is benign however.  Patient states that he is a virgin and therefore gonorrhea or chlamydia is not on the differential diagnosis  11/27/19 Patient presents today with right  flank pain. He states that he has not had a bowel movement in several days. The pain is made worse by not having a bowel movement. If he passes gas or has a bowel movement, the pain improves. He states that over the last 3 days he has had 3 very small bowel movements. This is not common for him. Usually he goes 2 or 3 times a day. He denies any melena or hematochezia. He denies any nausea or vomiting. He denies any hematuria or dysuria. He has no CVA tenderness today on exam. He has no cough or shortness of breath. Pain is not made worse by movement. He can twist his back and bend over with no pain. He has no rash in that area. He is tender to palpation in the epigastric area as well as in the lower abdomen.  At that time, my plan was: Patient has colicky right-sided abdominal pain. The pain is located where the circle was drawn on the diagram except posteriorly. I do not believe this is gallbladder or kidney stone. Instead I believe this is intestinal spasms brought on by constipation. He has no evidence of an acute abdomen. He denies any urinary symptoms. Therefore we will treat the patient for constipation with magnesium citrate 300 mL p.o. x1 and then reassess in 48 hours to see if pain is improving. Recheck immediately if worse  01/04/20 Patient states that he went  to an urgent care about 2 weeks ago.  He states that he was again having pain in his right upper quadrant.  The pain would occur after he would work all day long.  He states that the pain was sharp but it made him feel like he needed to drink more water.  He thought the pain was coming from dehydration.  However after having the pain off and on for 2 or 3 weeks, the patient ultimately went to an urgent care.  At the urgent care his lab work was reportedly normal however a urinalysis revealed blood.  He is here today at their recommendation to recheck his urinalysis.  It has now been 2 weeks since he went to the urgent care and today on his urine  test there is +1 blood negative protein negative nitrites and negative leukocyte esterase.  Of note, the patient does a physical job.  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  03/04/20 2 weeks ago, the patient developed pain in his left spermatic cord.  He denies any pain in the testicle.  He denies any pain in the epididymis.  He does have some occasional burning pain at the tip of his penis.  He denies any sexual activity.  He denies any gross hematuria or dysuria.  Urinalysis shows trace microscopic hematuria which is common finding for this patient however otherwise there is no nitrite or leukocyte esterase.  Patient denies any fevers or chills.  He denies any abdominal pain.  On exam today aside from some mild testicular atrophy, there is tenderness of the spermatic cord but there is no palpable mass.  There is no inguinal hernia.  Past Medical History:  Diagnosis Date  . GERD (gastroesophageal reflux disease)   . Reflux    Past Surgical History:  Procedure Laterality Date  . WISDOM TOOTH EXTRACTION     Current Outpatient Medications on File Prior to Visit  Medication Sig Dispense Refill  . fluticasone (CUTIVATE) 0.05 % cream Apply topically 2 (two) times daily.    Marland Kitchen nystatin cream (MYCOSTATIN) Apply 1 application topically 2 (two) times daily. 30 g 0  . triamcinolone cream (KENALOG) 0.1 % Apply 1 application topically 2 (two) times daily. 30 g 0  . mometasone (ELOCON) 0.1 % cream Apply 1 application topically daily. (Patient not taking: No sig reported) 45 g 0   No current facility-administered medications on file prior to visit.   Allergies  Allergen Reactions  . Penicillins Other (See Comments)    Pt. Is unsure of reaction.  Did it involve swelling of the face/tongue/throat, SOB, or low BP? Unknown Did it involve sudden or severe rash/hives, skin peeling, or any reaction on the inside of your mouth or nose? Unknown Did you need to seek medical  attention at a hospital or doctor's office? Unknown When did it last happen? Child If all above answers are "NO", may proceed with cephalosporin use.    Social History   Socioeconomic History  . Marital status: Single    Spouse name: Not on file  . Number of children: Not on file  . Years of education: Not on file  . Highest education level: Not on file  Occupational History  . Occupation: housekeeper/front desk  Tobacco Use  . Smoking status: Never Smoker  . Smokeless tobacco: Never Used  Substance and Sexual Activity  . Alcohol use: No  . Drug use: No  . Sexual activity: Never  Comment: works as Firefighter, at [employer]    Other Topics Concern  . Not on file  Social History Narrative  . Not on file   Social Determinants of Health   Financial Resource Strain: Not on file  Food Insecurity: Not on file  Transportation Needs: Not on file  Physical Activity: Not on file  Stress: Not on file  Social Connections: Not on file  Intimate Partner Violence: Not on file      Review of Systems  Skin: Positive for rash.  All other systems reviewed and are negative.      Objective:   Physical Exam Vitals reviewed.  Constitutional:      Appearance: He is well-developed.  Cardiovascular:     Rate and Rhythm: Normal rate and regular rhythm.     Heart sounds: Normal heart sounds.  Pulmonary:     Effort: Pulmonary effort is normal. No respiratory distress.     Breath sounds: Normal breath sounds. No wheezing or rales.  Abdominal:     General: Abdomen is flat. Bowel sounds are normal.     Palpations: Abdomen is soft. There is no hepatomegaly or mass.     Tenderness: There is no abdominal tenderness. There is no right CVA tenderness, left CVA tenderness, guarding or rebound. Negative signs include Murphy's sign, McBurney's sign and obturator sign.     Hernia: No hernia is present. There is no hernia in the left inguinal area or right inguinal area.  Genitourinary:     Penis: Normal.      Testes: Normal.        Right: Tenderness, swelling, testicular hydrocele or varicocele not present.        Left: Tenderness, swelling, testicular hydrocele or varicocele not present.     Epididymis:     Right: No tenderness.     Left: No tenderness.    Lymphadenopathy:     Lower Body: No right inguinal adenopathy. No left inguinal adenopathy.           Assessment & Plan:  Urinary frequency - Plan: Urine Culture, Urinalysis, Routine w reflex microscopic  Burning sensation - Plan: Urine Culture, Urinalysis, Routine w reflex microscopic, C. TRACHOMATIS/N. GONORRHOEAE RNA ([facility])  Testicular pain, left  I believe this is more likely musculoskeletal pain related to heavy lifting and physical activity.  I recommended trying diclofenac 75 mg twice daily for 1 week to see if the pain will improve.  I will send a urinalysis and a urine culture along with gonorrhea and Chlamydia testing however his physical exam today is normal except for some mild tenderness to palpation in the left spermatic cord.  There is no hernia or palpable mass or swelling

## 2020-03-04 NOTE — Addendum Note (Signed)
Addended by: Roxy Cedar on: 03/04/2020 09:34 AM   Modules accepted: Orders

## 2020-03-05 LAB — C. TRACHOMATIS/N. GONORRHOEAE RNA
C. trachomatis RNA, TMA: NOT DETECTED
N. gonorrhoeae RNA, TMA: NOT DETECTED

## 2020-03-05 LAB — URINE CULTURE
MICRO NUMBER:: 11315238
Result:: NO GROWTH
SPECIMEN QUALITY:: ADEQUATE

## 2020-03-07 ENCOUNTER — Ambulatory Visit: Payer: Self-pay | Admitting: Urology

## 2020-03-27 ENCOUNTER — Ambulatory Visit (INDEPENDENT_AMBULATORY_CARE_PROVIDER_SITE_OTHER): Payer: Self-pay | Admitting: Urology

## 2020-03-27 ENCOUNTER — Encounter: Payer: Self-pay | Admitting: Urology

## 2020-03-27 ENCOUNTER — Other Ambulatory Visit: Payer: Self-pay

## 2020-03-27 VITALS — BP 128/81 | HR 65 | Temp 98.7°F | Ht 69.5 in | Wt 220.0 lb

## 2020-03-27 DIAGNOSIS — D509 Iron deficiency anemia, unspecified: Secondary | ICD-10-CM

## 2020-03-27 DIAGNOSIS — R3129 Other microscopic hematuria: Secondary | ICD-10-CM

## 2020-03-27 DIAGNOSIS — N500 Atrophy of testis: Secondary | ICD-10-CM

## 2020-03-27 NOTE — Progress Notes (Signed)
Subjective: 1. Microhematuria   2. Testicular atrophy   3. Microcytic anemia      Joseph Whitehead is a 26 yo male who is sent in consultation by Dr. Dennard Schaumann for hematuria.   He had a CT stone study in May for right flank pain that was negative for stones or other GU findings. .  His UA on 01/04/20 had 3-10 RBC's.  He had a UA in 7/19 with 11-20 RBC's.  His Cr was 0.9 on 07/31/19.  He had a UA on 03/04/20 that showed 0-2 RBC's.  He was supposed to have a renal US but never got a call.   He had possible right epididymitis that was treated in May 2021.  He has had no gross hematuria.   He was given Cipro in May and in July.  He had a urine culture in July that was negative.   His UA today has 0-2 RBC's.  He is a non-smoker.  He has had no family history of hematuria.  He has a chronic mild anemia but the Hgb was up to 13 at his last visit but still had microcytic indices. .   He had testicular atrophy on his prior exam and his total testosterone was 303 with a free T of 64.  He has normal libido and erectile function.  ROS:  ROS  Allergies  Allergen Reactions  . Penicillins Other (See Comments)    Pt. Is unsure of reaction.  Did it involve swelling of the face/tongue/throat, SOB, or low BP? Unknown Did it involve sudden or severe rash/hives, skin peeling, or any reaction on the inside of your mouth or nose? Unknown Did you need to seek medical attention at a hospital or doctor's office? Unknown When did it last happen? Child If all above answers are "NO", may proceed with cephalosporin use.     Past Medical History:  Diagnosis Date  . GERD (gastroesophageal reflux disease)   . Reflux     Past Surgical History:  Procedure Laterality Date  . WISDOM TOOTH EXTRACTION      Social History   Socioeconomic History  . Marital status: Single    Spouse name: Not on file  . Number of children: Not on file  . Years of education: Not on file  . Highest education level: Not on file   Occupational History  . Occupation: housekeeper/front desk  Tobacco Use  . Smoking status: Never Smoker  . Smokeless tobacco: Never Used  Substance and Sexual Activity  . Alcohol use: No  . Drug use: No  . Sexual activity: Never    Comment: works as Nurse, mental health, at [employer].    Other Topics Concern  . Not on file  Social History Narrative  . Not on file   Social Determinants of Health   Financial Resource Strain: Not on file  Food Insecurity: Not on file  Transportation Needs: Not on file  Physical Activity: Not on file  Stress: Not on file  Social Connections: Not on file  Intimate Partner Violence: Not on file    Family History  Problem Relation Age of Onset  . Diabetes Mother   . Hyperlipidemia Mother   . Hypertension Mother   . Hypertension Father   . Cancer Maternal Grandmother        breast    Anti-infectives: Anti-infectives (From admission, onward)   None      Current Outpatient Medications  Medication Sig Dispense Refill  . fluticasone (CUTIVATE) 0.05 % cream Apply topically 2 (  two) times daily.    . mometasone (ELOCON) 0.1 % cream Apply 1 application topically daily. 45 g 0   No current facility-administered medications for this visit.     Objective: Vital signs in last 24 hours: BP 128/81   Pulse 65   Temp 98.7 F (37.1 C)   Ht 5' 9.5" (1.765 m)   Wt 220 lb (99.8 kg)   BMI 32.02 kg/m   Intake/Output from previous day: No intake/output data recorded. Intake/Output this shift: @IOTHISSHIFT @   Physical Exam  Lab Results:  No results found for this or any previous visit (from the past 24 hour(s)).  BMET No results for input(s): NA, K, CL, CO2, GLUCOSE, BUN, CREATININE, CALCIUM in the last 72 hours. PT/INR No results for input(s): LABPROT, INR in the last 72 hours. ABG No results for input(s): PHART, HCO3 in the last 72 hours.  Invalid input(s): PCO2, PO2 . Recent Results (from the past 2160 hour(s))  Urinalysis, Routine w reflex  microscopic     Status: Abnormal   Collection Time: 01/04/20  4:12 PM  Result Value Ref Range   Color, Urine YELLOW YELLOW   APPearance CLEAR CLEAR   Specific Gravity, Urine 1.025 1.001 - 1.03   pH 6.5 5.0 - 8.0   Glucose, UA NEGATIVE NEGATIVE   Bilirubin Urine NEGATIVE NEGATIVE   Ketones, ur TRACE (A) NEGATIVE   Hgb urine dipstick 1+ (A) NEGATIVE   Protein, ur NEGATIVE NEGATIVE   Nitrite NEGATIVE NEGATIVE   Leukocytes,Ua NEGATIVE NEGATIVE   WBC, UA NONE SEEN 0 - 5 /HPF   RBC / HPF 3-10 (A) 0 - 2 /HPF   Squamous Epithelial / LPF NONE SEEN < OR = 5 /HPF   Bacteria, UA NONE SEEN NONE SEEN /HPF   Hyaline Cast NONE SEEN NONE SEEN /LPF  Microscopic Message     Status: None   Collection Time: 01/04/20  4:12 PM  Result Value Ref Range   Note      Comment: This urine was analyzed for the presence of WBC,  RBC, bacteria, casts, and other formed elements.  Only those elements seen were reported. . .   Urinalysis, Routine w reflex microscopic     Status: None   Collection Time: 01/29/20 11:43 AM  Result Value Ref Range   Specific Gravity, UA 1.020 1.005 - 1.030   pH, UA 7.0 5.0 - 7.5   Color, UA Yellow Yellow   Appearance Ur Clear Clear   Leukocytes,UA Negative Negative   Protein,UA Negative Negative/Trace   Glucose, UA Negative Negative   Ketones, UA Negative Negative   RBC, UA Negative Negative   Bilirubin, UA Negative Negative   Urobilinogen, Ur 0.2 0.2 - 1.0 mg/dL   Nitrite, UA Negative Negative   Microscopic Examination Comment     Comment: Microscopic follows if indicated.  Testosterone Free, Profile I     Status: None   Collection Time: 01/29/20 12:46 PM  Result Value Ref Range   Albumin 4.7 4.1 - 5.2 g/dL   Testosterone 289 264 - 916 ng/dL    Comment: Adult male reference interval is based on a population of healthy nonobese males (BMI <30) between 60 and 70 years old. [place], [place] [phone]. PMID: [id number].    Sex Hormone Binding 23.9 16.5 - 55.9  nmol/L   Testost., Free, Calc 64.1 47.7 - 173.9 pg/mL  Testosterone     Status: None   Collection Time: 01/29/20 12:46 PM  Result Value Ref Range  Testosterone 303 264 - 916 ng/dL    Comment: Adult male reference interval is based on a population of healthy nonobese males (BMI <30) between 51 and 53 years old. [place], [place] [phone]. PMID: [id number].   CBC     Status: Abnormal   Collection Time: 01/29/20 12:47 PM  Result Value Ref Range   WBC 7.4 3.4 - 10.8 x10E3/uL   RBC 5.38 4.14 - 5.80 x10E6/uL   Hemoglobin 13.0 13.0 - 17.7 g/dL   Hematocrit 41.7 37.5 - 51.0 %   MCV 78 (L) 79 - 97 fL   MCH 24.2 (L) 26.6 - 33.0 pg   MCHC 31.2 (L) 31.5 - 35.7 g/dL   RDW 15.8 (H) 11.6 - 15.4 %   Platelets 328 150 - 450 [id number]  Basic metabolic panel     Status: None   Collection Time: 01/29/20 12:47 PM  Result Value Ref Range   Glucose 87 65 - 99 mg/dL   BUN 17 6 - 20 mg/dL   Creatinine, Ser 1.00 0.76 - 1.27 mg/dL   GFR calc non Af Amer 103 >59 mL/min/1.73   GFR calc Af Amer 120 >59 mL/min/1.73    Comment: **In accordance with recommendations from the NKF-ASN Task force,**   [facility] is in the process of updating its eGFR calculation to the   2021 CKD-EPI creatinine equation that estimates kidney function   without a race variable.    BUN/Creatinine Ratio 17 9 - 20   Sodium 140 134 - 144 mmol/L   Potassium 4.8 3.5 - 5.2 mmol/L   Chloride 100 96 - 106 mmol/L   CO2 23 20 - 29 mmol/L   Calcium 9.8 8.7 - 10.2 mg/dL  Urine Culture     Status: None   Collection Time: 03/04/20  9:16 AM   Specimen: Urine  Result Value Ref Range   MICRO NUMBER: [id number]    SPECIMEN QUALITY: Adequate    Sample Source NOT GIVEN    STATUS: FINAL    Result: No Growth   Urinalysis, Routine w reflex microscopic     Status: Abnormal   Collection Time: 03/04/20  9:16 AM  Result Value Ref Range   Color, Urine YELLOW YELLOW   APPearance CLEAR CLEAR   Specific Gravity, Urine 1.025 1.001 - 1.03    pH 6.0 5.0 - 8.0   Glucose, UA NEGATIVE NEGATIVE   Bilirubin Urine NEGATIVE NEGATIVE   Ketones, ur NEGATIVE NEGATIVE   Hgb urine dipstick TRACE (A) NEGATIVE   Protein, ur NEGATIVE NEGATIVE   Nitrite NEGATIVE NEGATIVE   Leukocytes,Ua NEGATIVE NEGATIVE   WBC, UA NONE SEEN 0 - 5 /HPF   RBC / HPF 0-2 0 - 2 /HPF   Squamous Epithelial / LPF NONE SEEN < OR = 5 /HPF   Bacteria, UA NONE SEEN NONE SEEN /HPF   Hyaline Cast NONE SEEN NONE SEEN /LPF  Microscopic Message     Status: None   Collection Time: 03/04/20  9:16 AM  Result Value Ref Range   Note      Comment: This urine was analyzed for the presence of WBC,  RBC, bacteria, casts, and other formed elements.  Only those elements seen were reported. . Loletha Grayer TRACHOMATIS/N. GONORRHOEAE RNA ([facility])     Status: None   Collection Time: 03/04/20  9:36 AM   Specimen: Urine  Result Value Ref Range   C. trachomatis RNA, TMA NOT DETECTED NOT DETECT   N. gonorrhoeae RNA, TMA NOT DETECTED NOT  DETECT    Comment: The analytical performance characteristics of this assay, when used to test SurePath(TM) specimens have been determined by [employer]. The modifications have not been cleared or approved by the FDA. This assay has been validated pursuant to the CLIA regulations and is used for clinical purposes. . For additional information, please refer to https://education.[facility].com/faq/FAQ154 (This link is being provided for information/ educational purposes only.) .     Studies/Results: UA has 0-2 RBC's today. CBC and testosterone levels reviewed.    Assessment/Plan: Microhematuria that has been present for at least 2 years.   I am going to reorder the renal US and will call with the results.  I will consider cystoscopy if the renal US is unrevealing and the hematuria recurs.  Otherwise he will return in 6 months.  Testicular atrophy.  Borderline low total T but normal free T and no symptoms.  Microcytic anemia.  Hgb  was normal on the last labs but he still has microcytic changes.   No orders of the defined types were placed in this encounter.    Orders Placed This Encounter  Procedures  . US RENAL    Standing Status:   Future    Standing Expiration Date:   04/27/2020    Order Specific Question:   Reason for Exam (SYMPTOM  OR DIAGNOSIS REQUIRED)    Answer:   microhematuria    Order Specific Question:   Preferred imaging location?    Answer:   [facility]     Return in about 6 months (around 09/24/2020).    CC: Jenna Luo MD.      Irine Seal 03/27/2020 [phone]

## 2020-03-27 NOTE — Progress Notes (Signed)

## 2020-04-04 ENCOUNTER — Other Ambulatory Visit: Payer: Self-pay

## 2020-04-04 ENCOUNTER — Ambulatory Visit (HOSPITAL_COMMUNITY)
Admission: RE | Admit: 2020-04-04 | Discharge: 2020-04-04 | Disposition: A | Discharge status: Home / Self Care | Payer: Self-pay | Source: Ambulatory Visit | Attending: Urology | Admitting: Urology

## 2020-04-04 DIAGNOSIS — R3129 Other microscopic hematuria: Secondary | ICD-10-CM | POA: Insufficient documentation

## 2020-04-05 NOTE — Progress Notes (Signed)
Renal US is normal.

## 2020-04-07 NOTE — Progress Notes (Signed)
Results mailed to patient;.

## 2020-04-08 NOTE — Progress Notes (Signed)
Letter mailed to pt.  

## 2020-06-19 ENCOUNTER — Telehealth: Payer: Self-pay | Admitting: *Deleted

## 2020-06-19 ENCOUNTER — Other Ambulatory Visit: Payer: Self-pay

## 2020-06-19 ENCOUNTER — Encounter: Payer: Self-pay | Admitting: Family Medicine

## 2020-06-19 ENCOUNTER — Ambulatory Visit (INDEPENDENT_AMBULATORY_CARE_PROVIDER_SITE_OTHER): Payer: Self-pay | Admitting: Family Medicine

## 2020-06-19 VITALS — BP 128/64 | HR 86 | Temp 98.4°F | Resp 16 | Ht 69.5 in | Wt 222.0 lb

## 2020-06-19 DIAGNOSIS — A084 Viral intestinal infection, unspecified: Secondary | ICD-10-CM

## 2020-06-19 MED ORDER — ONDANSETRON HCL 4 MG PO TABS: 4.0000 mg | ORAL_TABLET | Freq: Three times a day (TID) | ORAL | 0 refills | Status: DC | PRN

## 2020-06-19 NOTE — Telephone Encounter (Signed)
Received call from patient.   Reports that he has been having GI upset from possible food poisoning since Sunday 06/15/2020. He has not been to work this week and is requesting note for work.   Advised that he will need OV first. Advised that he can schedule virtually with NP

## 2020-06-19 NOTE — Progress Notes (Signed)
Subjective:    Patient ID: Joseph Whitehead, male    DOB: 08-Dec-1995, 25 y.o.   MRN: [id number]   Patient is a 25 year old who presents today complaining of food poisoning.  He states Sunday after he ate at a [employer] he developed nausea and severe vomiting.  He vomited several times Sunday night through Monday.  He developed watery diarrhea on Monday that persisted through Wednesday.  Today he has not had any diarrhea and has not had any vomiting however he still feels extremely weak and tired.  He is basically just been trying to drink water and Gatorade and eat a very bland diet.  He denies any abdominal pain today.  He denies any hematuria.  He denies any hematochezia.  He denies any fevers or chills. Past Medical History:  Diagnosis Date  . GERD (gastroesophageal reflux disease)   . Reflux    Past Surgical History:  Procedure Laterality Date  . WISDOM TOOTH EXTRACTION     Current Outpatient Medications on File Prior to Visit  Medication Sig Dispense Refill  . ferrous sulfate (FEROSUL) 325 (65 FE) MG tablet Take 325 mg by mouth daily with breakfast.    . fluticasone (CUTIVATE) 0.05 % cream Apply topically 2 (two) times daily.    . mometasone (ELOCON) 0.1 % cream Apply 1 application topically daily. 45 g 0   No current facility-administered medications on file prior to visit.   Allergies  Allergen Reactions  . Penicillins Other (See Comments)    Pt. Is unsure of reaction.  Did it involve swelling of the face/tongue/throat, SOB, or low BP? Unknown Did it involve sudden or severe rash/hives, skin peeling, or any reaction on the inside of your mouth or nose? Unknown Did you need to seek medical attention at a hospital or doctor's office? Unknown When did it last happen? Child If all above answers are "NO", may proceed with cephalosporin use.    Social History   Socioeconomic History  . Marital status: Single    Spouse name: Not on file  . Number of children:  Not on file  . Years of education: Not on file  . Highest education level: Not on file  Occupational History  . Occupation: housekeeper/front desk  Tobacco Use  . Smoking status: Never Smoker  . Smokeless tobacco: Never Used  Substance and Sexual Activity  . Alcohol use: No  . Drug use: No  . Sexual activity: Never    Comment: works as Firefighter, at [employer]    Other Topics Concern  . Not on file  Social History Narrative  . Not on file   Social Determinants of Health   Financial Resource Strain: Not on file  Food Insecurity: Not on file  Transportation Needs: Not on file  Physical Activity: Not on file  Stress: Not on file  Social Connections: Not on file  Intimate Partner Violence: Not on file      Review of Systems  All other systems reviewed and are negative.      Objective:   Physical Exam Vitals reviewed.  Constitutional:      Appearance: He is well-developed.  Cardiovascular:     Rate and Rhythm: Normal rate and regular rhythm.     Heart sounds: Normal heart sounds.  Pulmonary:     Effort: Pulmonary effort is normal. No respiratory distress.     Breath sounds: Normal breath sounds. No wheezing or rales.  Abdominal:     General: Abdomen is flat. Bowel  sounds are normal.     Palpations: Abdomen is soft. There is no hepatomegaly or mass.     Tenderness: There is no abdominal tenderness. There is no right CVA tenderness, left CVA tenderness, guarding or rebound. Negative signs include Murphy's sign, McBurney's sign and obturator sign.     Hernia: No hernia is present.           Assessment & Plan:  Viral gastroenteritis  I suspect viral gastroenteritis.  I will write the patient out of work today and for the first part of this week.  I think he can return to work starting Saturday which is his next scheduled shift.  I recommended Gatorade and clear liquids.  He can also eat chicken noodle soup and bland items until his stomach feels better.  He can use  Zofran 4 mg every 8 hours as needed for nausea.

## 2020-08-07 ENCOUNTER — Ambulatory Visit: Payer: 59 | Admitting: Nurse Practitioner

## 2020-08-07 ENCOUNTER — Other Ambulatory Visit: Payer: Self-pay

## 2020-08-07 VITALS — BP 140/80 | HR 96 | Temp 97.7°F | Ht 68.5 in | Wt 225.2 lb

## 2020-08-07 DIAGNOSIS — N50819 Testicular pain, unspecified: Secondary | ICD-10-CM | POA: Diagnosis not present

## 2020-08-07 LAB — URINALYSIS, ROUTINE W REFLEX MICROSCOPIC
Bacteria, UA: NONE SEEN /HPF
Bilirubin Urine: NEGATIVE
Glucose, UA: NEGATIVE
Hyaline Cast: NONE SEEN /LPF
Ketones, ur: NEGATIVE
Leukocytes,Ua: NEGATIVE
Nitrite: NEGATIVE
Protein, ur: NEGATIVE
Specific Gravity, Urine: 1.028 (ref 1.001–1.035)
Squamous Epithelial / HPF: NONE SEEN /HPF (ref <=5)
WBC, UA: NONE SEEN /HPF (ref 0–5)
pH: 6 (ref 5.0–8.0)

## 2020-08-07 LAB — MICROSCOPIC MESSAGE

## 2020-08-07 MED ORDER — CIPROFLOXACIN HCL 500 MG PO TABS: 500.0000 mg | ORAL_TABLET | Freq: Two times a day (BID) | ORAL | 0 refills | Status: DC

## 2020-08-07 MED ORDER — DICLOFENAC SODIUM 75 MG PO TBEC: 75.0000 mg | DELAYED_RELEASE_TABLET | Freq: Two times a day (BID) | ORAL | 0 refills | Status: DC

## 2020-08-07 NOTE — Progress Notes (Signed)
Subjective:    Patient ID: Joseph Whitehead, male    DOB: [birth year], 25 y.o.   MRN: [id number]  HPI: Joseph Whitehead is a 25 y.o. male presenting for testicular pain.  Chief Complaint  Patient presents with  . Pain    Pain in the testiscle radiating from side to side, hurts when laying down, slept sitting up in chair for comfort   TESTICULAR PAIN Duration: Acute on chronic- had the same pain about 1 year ago and went to the hospital because it was more severe.    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  Pain started after he sat down while at work. Location: both  Description: mild discomfort; felt like he got punched down there Character: Severity: mild Timing: hurts only when laying down worse, sitting Alleviating factors: sitting up right Recent heavy lifting/strenous activity: works at a gym Recently sexual activity: not currently sexually active Skin lesions on testicles or penis: no  Penile discharge: no  ABDOMINAL PAIN  Duration: 2 days Onset: acute with testicle pain, occurs on same side as testicle pain when testicle pain is present Severity: mild Quality: aching Location:  Lower abdomen  Episode duration: seconds Radiation: no Frequency: intermittent Fever: no Nausea: no Vomiting: no Weight loss: no Decreased appetite: no Diarrhea: no Constipation: no Blood in stool: no Heartburn: no Jaundice: no Rash: no Dysuria/urinary frequency: no Hematuria: no Recurrent NSAID use: takes advil for headache very rarely - less frequent thatn once per month.    Allergies  Allergen Reactions  . Penicillins Other (See Comments)    Pt. Is unsure of reaction.  Did it involve swelling of the face/tongue/throat, SOB, or low BP? Unknown Did it involve sudden or severe rash/hives, skin peeling, or any reaction on the inside of your mouth or nose? Unknown Did you need to seek medical attention at a hospital or doctor's office? Unknown When did it last happen?  Child If all above answers are "NO", may proceed with cephalosporin use.     Outpatient Encounter Medications as of 08/07/2020  Medication Sig  . ciprofloxacin (CIPRO) 500 MG tablet Take 1 tablet (500 mg total) by mouth 2 (two) times daily.  . diclofenac (VOLTAREN) 75 MG EC tablet Take 1 tablet (75 mg total) by mouth 2 (two) times daily. Take with food to prevent GI irritation  . fluticasone (CUTIVATE) 0.05 % cream Apply topically 2 (two) times daily.  . mometasone (ELOCON) 0.1 % cream Apply 1 application topically daily.  . ondansetron (ZOFRAN) 4 MG tablet Take 1 tablet (4 mg total) by mouth every 8 (eight) hours as needed for nausea or vomiting.  . [DISCONTINUED] ferrous sulfate (FEROSUL) 325 (65 FE) MG tablet Take 325 mg by mouth daily with breakfast.   No facility-administered encounter medications on file as of 08/07/2020.    There are no problems to display for this patient.   Past Medical History:  Diagnosis Date  . GERD (gastroesophageal reflux disease)   . Reflux     Relevant past medical, surgical, family and social history reviewed and updated as indicated. Interim medical history since our last visit reviewed.  Review of Systems Per HPI unless specifically indicated above     Objective:    BP 140/80   Pulse 96   Temp 97.7 F (36.5 C)   Ht 5' 8.5" (1.74 m)   Wt 225 lb 3.2 oz (102.2 kg)   SpO2 100%   BMI 33.74 kg/m   Wt Readings  from Last 3 Encounters:  08/07/20 225 lb 3.2 oz (102.2 kg)  06/19/20 222 lb (100.7 kg)  03/27/20 220 lb (99.8 kg)    Physical Exam Vitals and nursing note reviewed. Exam conducted with a chaperone present (AW).  Constitutional:      General: He is not in acute distress.    Appearance: Normal appearance. He is not toxic-appearing.  HENT:     Head: Normocephalic and atraumatic.  Eyes:     General: No scleral icterus. Abdominal:     Hernia: There is no hernia in the left inguinal area or right inguinal area.   Genitourinary:    Penis: Normal.      Testes:        Right: Tenderness present. Mass not present.        Left: Mass or tenderness not present.     Epididymis:     Right: Tenderness present.     Left: Normal. No tenderness.  Lymphadenopathy:     Lower Body: No right inguinal adenopathy. No left inguinal adenopathy.  Skin:    General: Skin is warm and dry.     Capillary Refill: Capillary refill takes less than 2 seconds.     Coloration: Skin is not jaundiced or pale.     Findings: No erythema, lesion or rash.  Neurological:     Mental Status: He is alert and oriented to person, place, and time.  Psychiatric:        Mood and Affect: Mood normal.        Behavior: Behavior normal.        Thought Content: Thought content normal.        Judgment: Judgment normal.       Assessment & Plan:  1. Testicle tenderness Acute.  No signs of testicular torsion today on examination.  Similar pain in the past, previously ultrasound showed possible epididymitis with bilateral hydroceles.  He has seen a urologist in the past- plan is to follow-up in July for hematuria and testicular atrophy.  Renal ultrasound in January 2022 was normal.  Urinalysis today shows 0-2 red blood cells, otherwise unremarkable.  Will obtain repeat scrotal ultrasound with Doppler and start on twice daily fluoroquinolone for possible epididymitis.  Plan to follow-up with urology should symptoms not improve with treatment or should they worsen.  Diclofenac given for pain control in the meantime.  - Urinalysis, Routine w reflex microscopic - US SCROTUM W/DOPPLER; Future - ciprofloxacin (CIPRO) 500 MG tablet; Take 1 tablet (500 mg total) by mouth 2 (two) times daily.  Dispense: 14 tablet; Refill: 0 - diclofenac (VOLTAREN) 75 MG EC tablet; Take 1 tablet (75 mg total) by mouth 2 (two) times daily. Take with food to prevent GI irritation  Dispense: 30 tablet; Refill: 0 - C. trachomatis/N. gonorrhoeae RNA - Trichomonas vaginalis,  RNA    Follow up plan: Return if symptoms worsen or fail to improve.

## 2020-08-08 ENCOUNTER — Ambulatory Visit: Payer: Self-pay | Admitting: Family Medicine

## 2020-08-08 LAB — TRICHOMONAS VAGINALIS RNA, QL,MALES: Trichomonas vaginalis RNA: NOT DETECTED

## 2020-08-08 LAB — C. TRACHOMATIS/N. GONORRHOEAE RNA
C. trachomatis RNA, TMA: NOT DETECTED
N. gonorrhoeae RNA, TMA: NOT DETECTED

## 2020-08-13 ENCOUNTER — Ambulatory Visit (HOSPITAL_COMMUNITY): Payer: 59

## 2020-08-13 ENCOUNTER — Ambulatory Visit (HOSPITAL_COMMUNITY)
Admission: RE | Admit: 2020-08-13 | Discharge: 2020-08-13 | Disposition: A | Discharge status: Home / Self Care | Payer: 59 | Source: Ambulatory Visit | Attending: Nurse Practitioner | Admitting: Nurse Practitioner

## 2020-08-13 ENCOUNTER — Other Ambulatory Visit: Payer: Self-pay

## 2020-08-13 DIAGNOSIS — N433 Hydrocele, unspecified: Secondary | ICD-10-CM | POA: Diagnosis not present

## 2020-08-13 DIAGNOSIS — N50819 Testicular pain, unspecified: Secondary | ICD-10-CM | POA: Diagnosis not present

## 2020-08-13 DIAGNOSIS — N50811 Right testicular pain: Secondary | ICD-10-CM | POA: Diagnosis not present

## 2020-09-25 ENCOUNTER — Ambulatory Visit: Payer: Self-pay | Admitting: Urology

## 2020-11-27 ENCOUNTER — Ambulatory Visit: Payer: Self-pay | Admitting: Urology

## 2021-01-29 ENCOUNTER — Ambulatory Visit: Payer: 59 | Admitting: Urology

## 2021-02-10 DIAGNOSIS — U071 COVID-19: Secondary | ICD-10-CM | POA: Diagnosis not present

## 2021-03-26 ENCOUNTER — Ambulatory Visit: Payer: Self-pay | Admitting: Urology

## 2021-04-28 DIAGNOSIS — L906 Striae atrophicae: Secondary | ICD-10-CM | POA: Diagnosis not present

## 2021-04-28 DIAGNOSIS — L02423 Furuncle of right upper limb: Secondary | ICD-10-CM | POA: Diagnosis not present

## 2021-04-28 DIAGNOSIS — L02425 Furuncle of right lower limb: Secondary | ICD-10-CM | POA: Diagnosis not present

## 2021-04-28 DIAGNOSIS — L02424 Furuncle of left upper limb: Secondary | ICD-10-CM | POA: Diagnosis not present

## 2021-04-28 DIAGNOSIS — L280 Lichen simplex chronicus: Secondary | ICD-10-CM | POA: Diagnosis not present

## 2021-04-28 DIAGNOSIS — L02426 Furuncle of left lower limb: Secondary | ICD-10-CM | POA: Diagnosis not present

## 2021-06-09 DIAGNOSIS — L280 Lichen simplex chronicus: Secondary | ICD-10-CM | POA: Diagnosis not present

## 2021-06-09 DIAGNOSIS — L0202 Furuncle of face: Secondary | ICD-10-CM | POA: Diagnosis not present

## 2021-10-15 DIAGNOSIS — L280 Lichen simplex chronicus: Secondary | ICD-10-CM | POA: Diagnosis not present

## 2021-10-15 DIAGNOSIS — L304 Erythema intertrigo: Secondary | ICD-10-CM | POA: Diagnosis not present

## 2021-11-25 IMAGING — CT CT RENAL STONE PROTOCOL
2 of 4 series · 16 of 46 positions shown, 18 images · non-contrast
Comparison: None.

CLINICAL DATA: Flank pain on the right

EXAM:
CT ABDOMEN AND PELVIS WITHOUT CONTRAST
TECHNIQUE: Multidetector CT imaging of the abdomen and pelvis was performed
following the standard protocol without IV contrast.

[Series 2: axial st · axial · 0.81mm/px · z∈[-336,+74]mm · 13 of 92 slices shown, 15 images]
[im 5/92  soft-tissue]
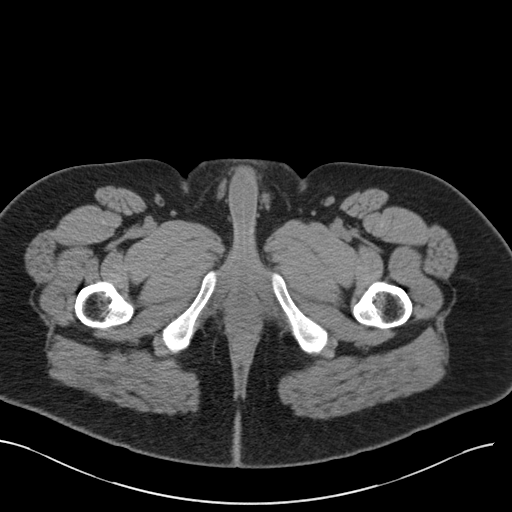
[im 5/92  bone]
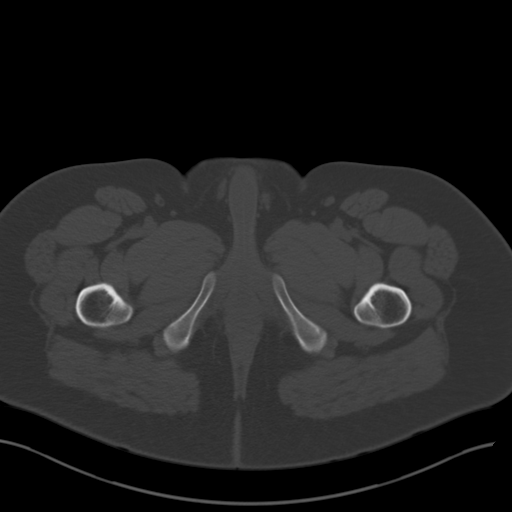
[im 14/92  soft-tissue]
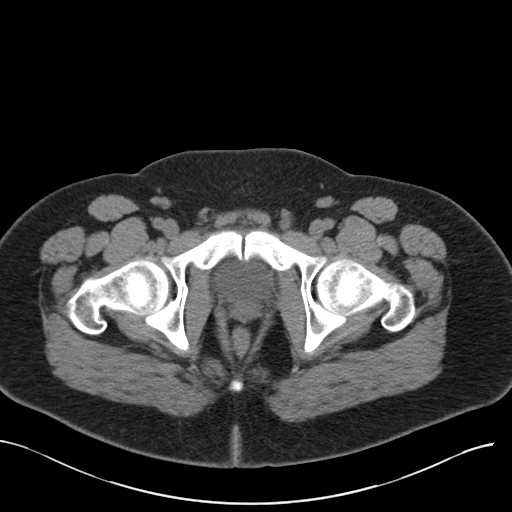
[im 18/92  soft-tissue]
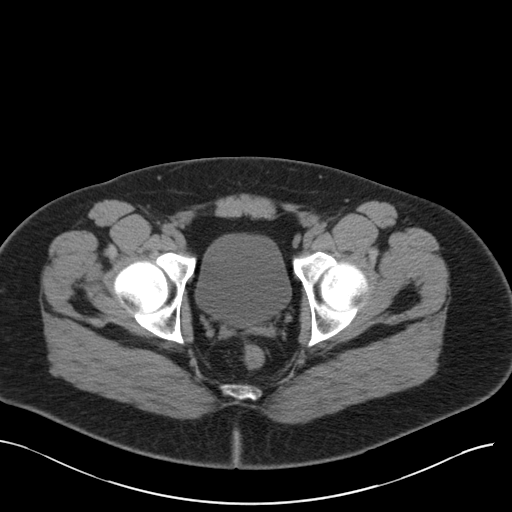
[im 27/92  soft-tissue]
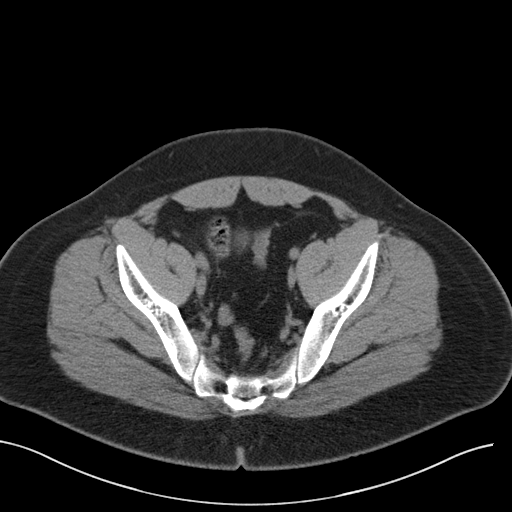
[im 31/92  soft-tissue]
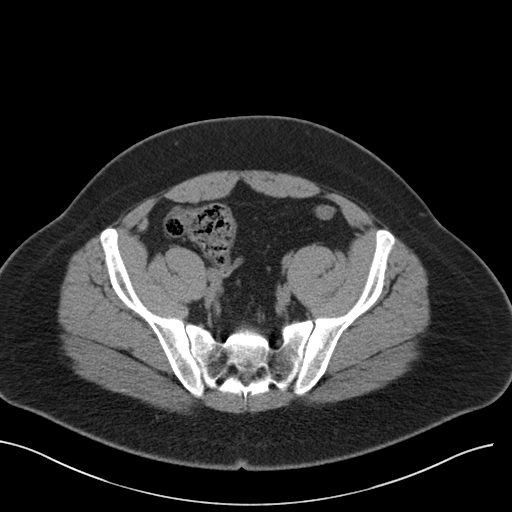
[im 40/92  soft-tissue]
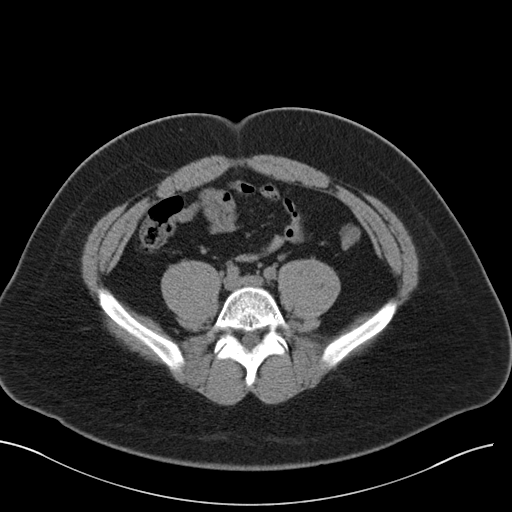
[im 48/92  soft-tissue]
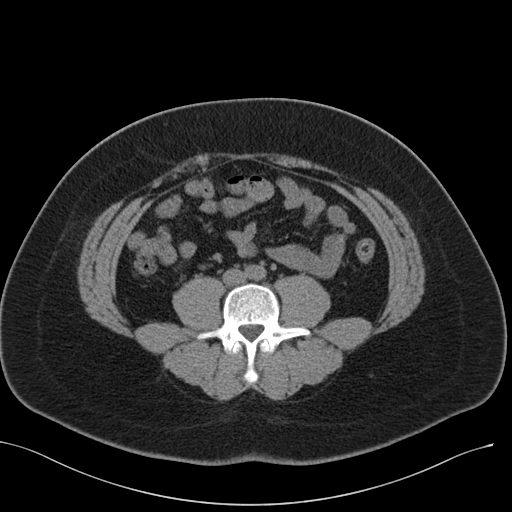
[im 53/92  soft-tissue]
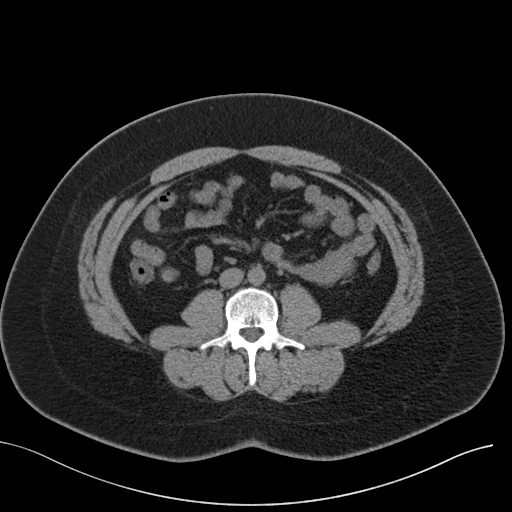
[im 61/92  soft-tissue]
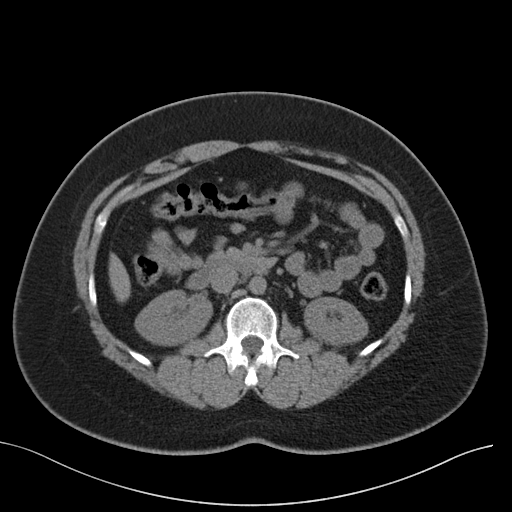
[im 61/92  bone]
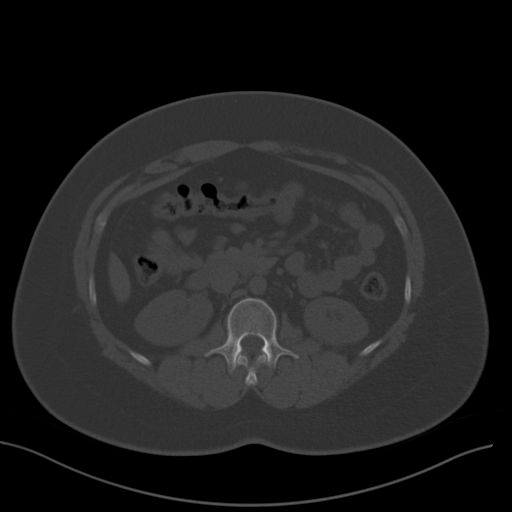
[im 66/92  soft-tissue]
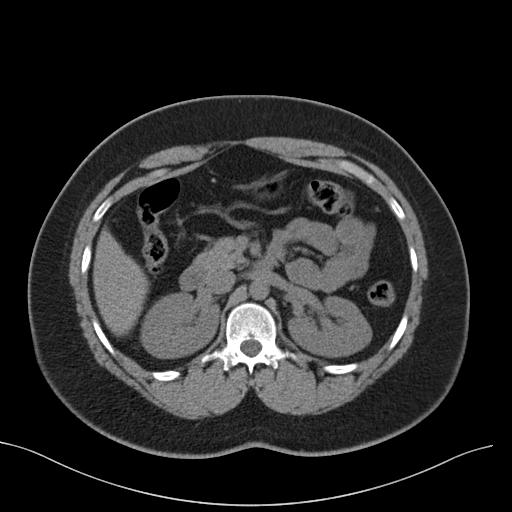
[im 74/92  soft-tissue]
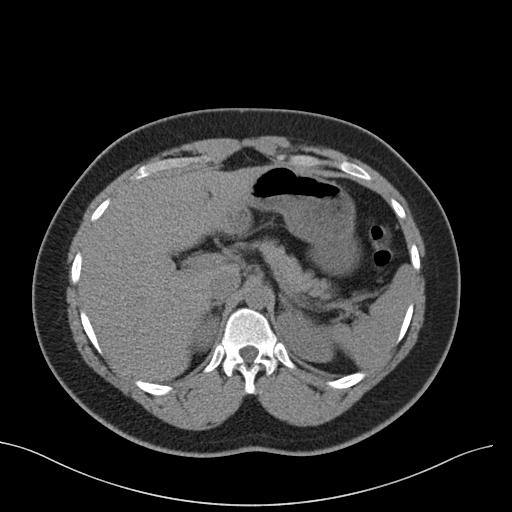
[im 79/92  soft-tissue]
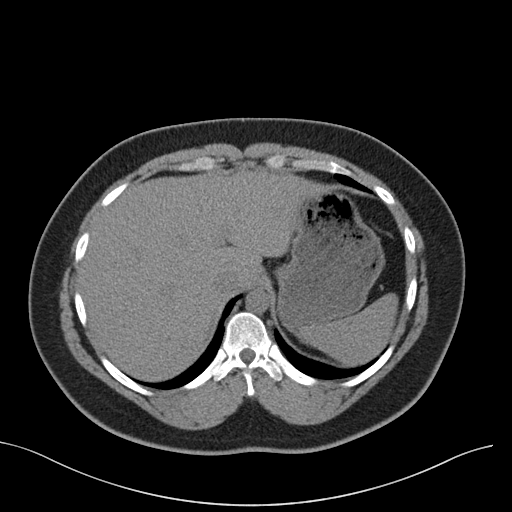
[im 87/92  soft-tissue]
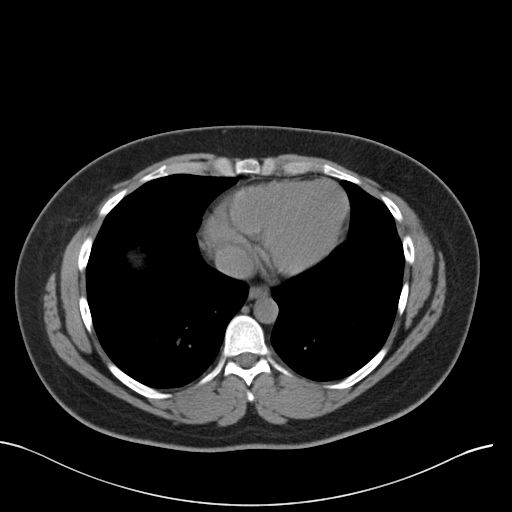

[Series 4: coronal · coronal · 0.73mm/px · 3 of 161 slices shown]
[im 54/161  soft-tissue]
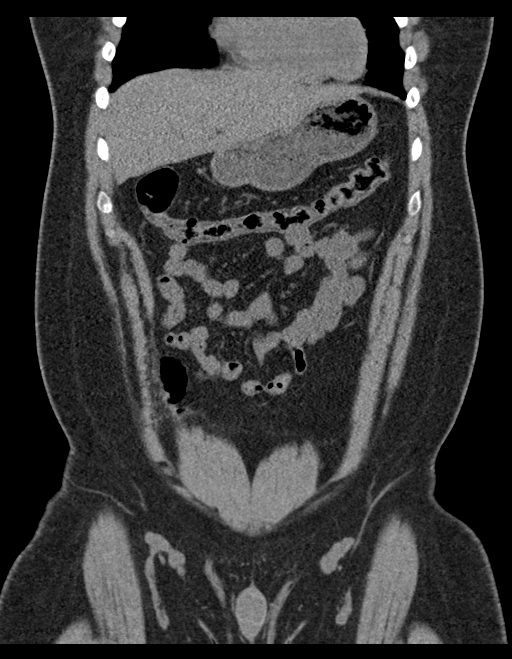
[im 72/161  soft-tissue]
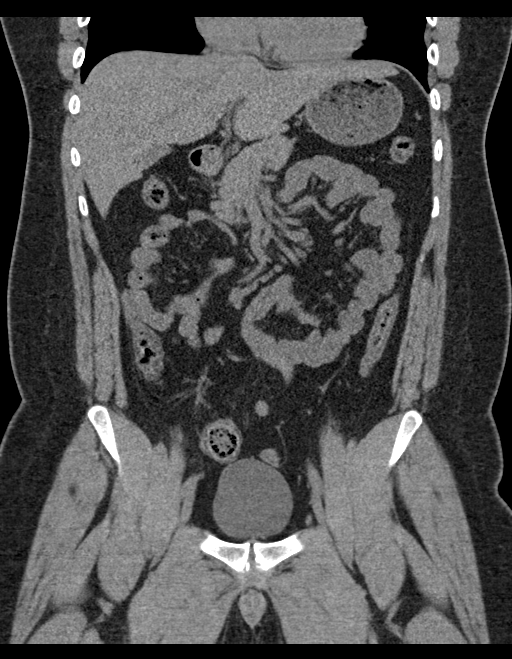
[im 89/161  soft-tissue]
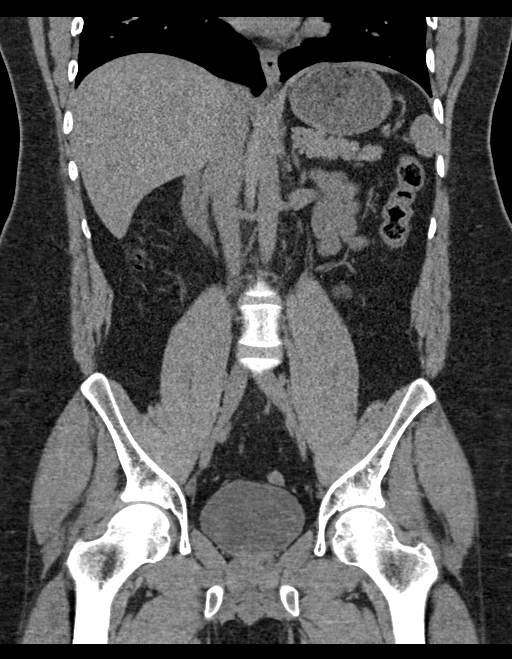

[16 of 46 positions shown; findings below may reference images not displayed]

FINDINGS: Lower chest: No acute abnormality.

Hepatobiliary: No focal liver abnormality is seen. No gallstones,
gallbladder wall thickening, or biliary dilatation.

Pancreas: Unremarkable. No pancreatic ductal dilatation or
surrounding inflammatory changes.

Spleen: Normal in size without focal abnormality.

Adrenals/Urinary Tract: Adrenal glands are unremarkable. Kidneys are
normal, without renal calculi, focal lesion, or hydronephrosis.
Bladder is unremarkable.

Stomach/Bowel: Stomach is within normal limits. Appendix appears
normal. No evidence of bowel wall thickening, distention, or
inflammatory changes.

Vascular/Lymphatic: No significant vascular findings are present. No
enlarged abdominal or pelvic lymph nodes.

Reproductive: Prostate is unremarkable.

Other: No abdominal wall hernia or abnormality. No abdominopelvic
ascites.

Musculoskeletal: No acute or significant osseous findings.
IMPRESSION: No evidence of renal calculi or obstructive change.

Normal-appearing appendix.

No acute abnormality noted.

## 2022-01-18 DIAGNOSIS — L304 Erythema intertrigo: Secondary | ICD-10-CM | POA: Diagnosis not present

## 2022-01-18 DIAGNOSIS — L280 Lichen simplex chronicus: Secondary | ICD-10-CM | POA: Diagnosis not present

## 2022-07-31 IMAGING — US US RENAL
1 series · 14 of 25 positions shown · non-contrast
Comparison: Noncontrast CT 07/31/2019.

CLINICAL DATA: Microhematuria.

EXAM:
RENAL / URINARY TRACT ULTRASOUND COMPLETE

[Series 1: us renal · 14 of 45 slices shown]
[im 1/45]
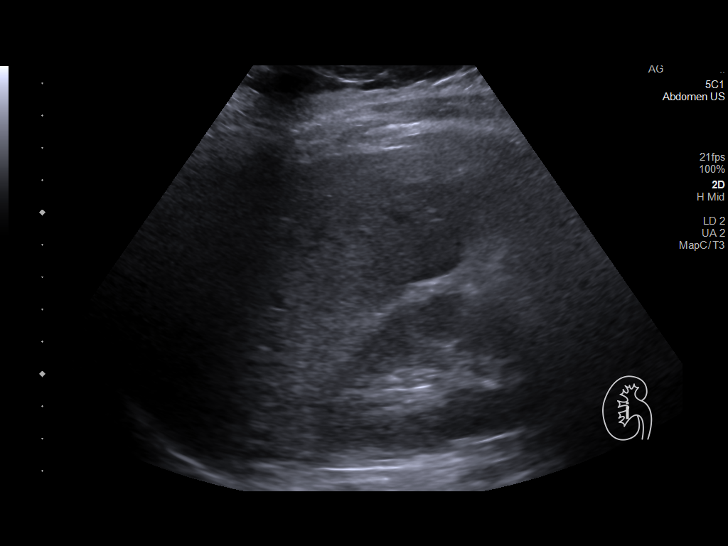
[im 4/45]
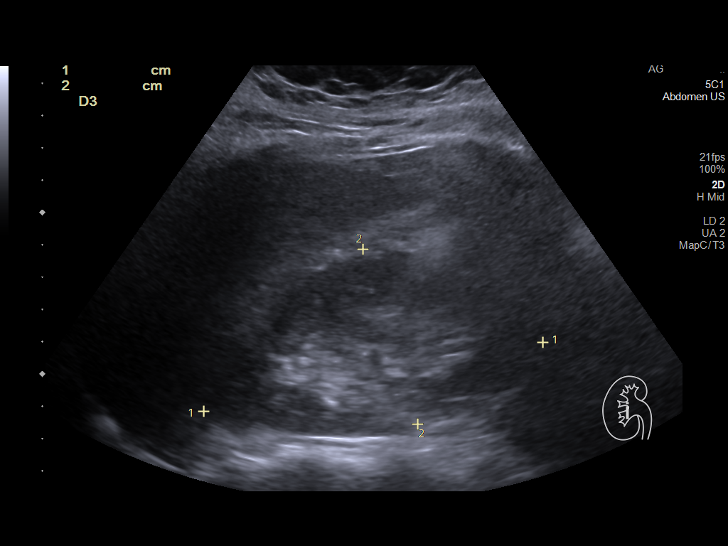
[im 8/45]
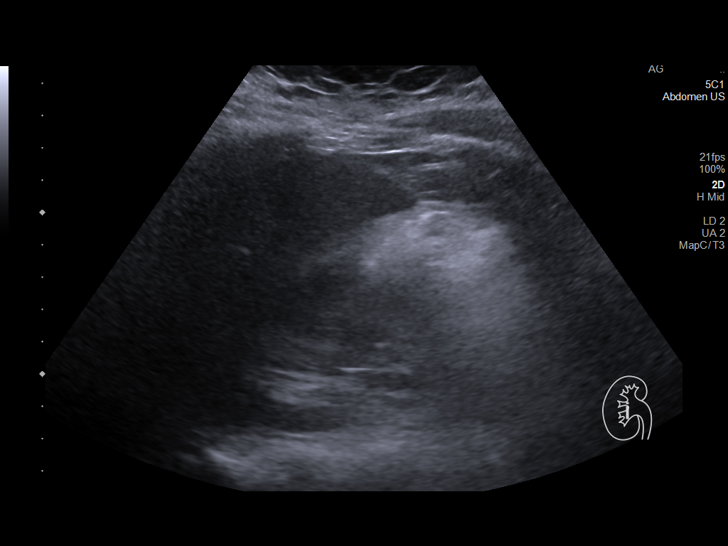
[im 12/45]
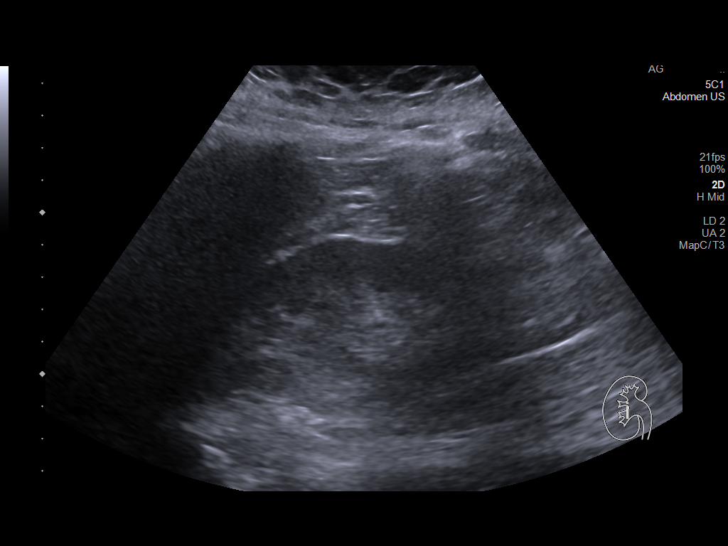
[im 15/45]
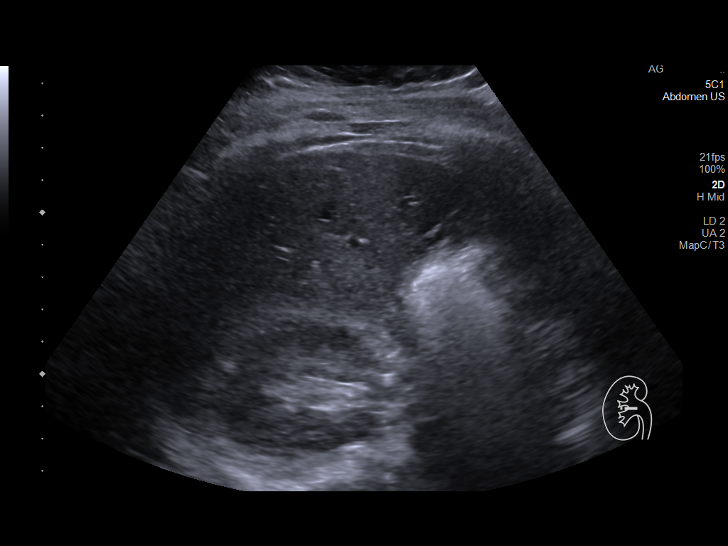
[im 17/45]
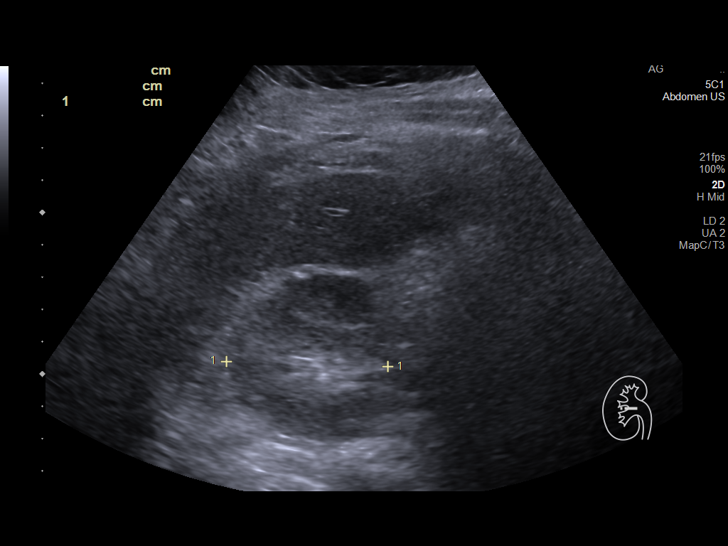
[im 21/45]
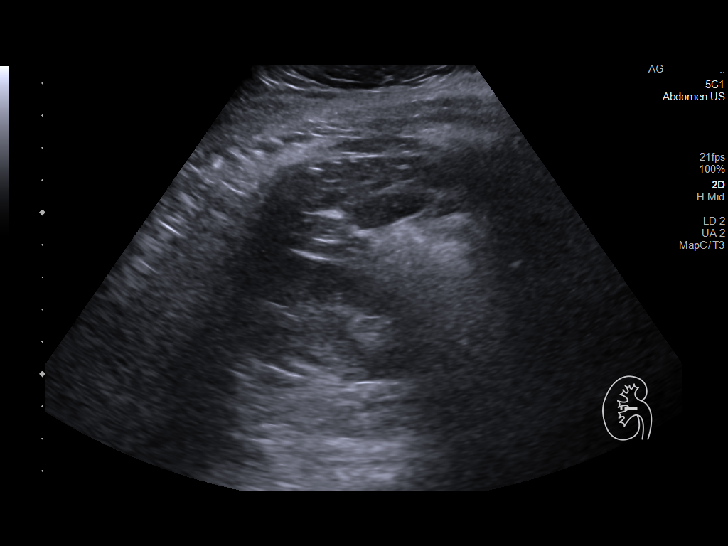
[im 24/45]
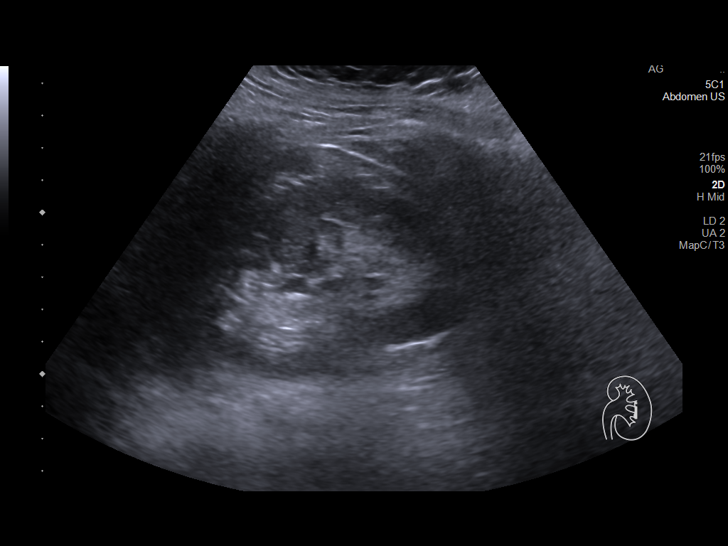
[im 28/45]
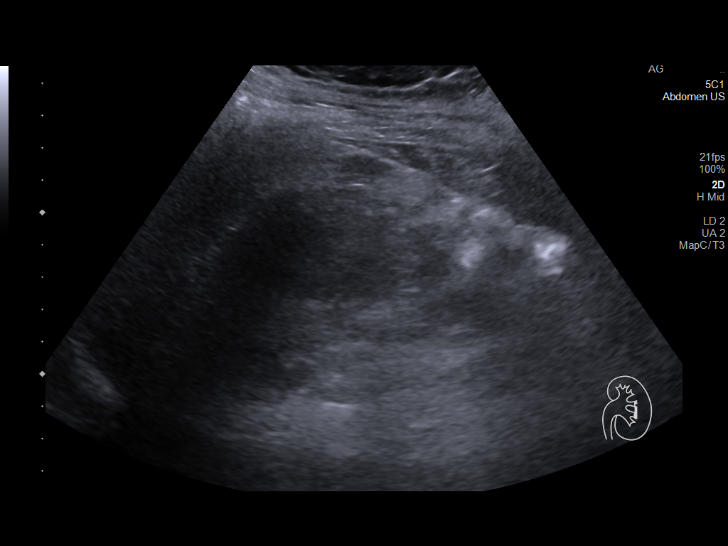
[im 30/45]
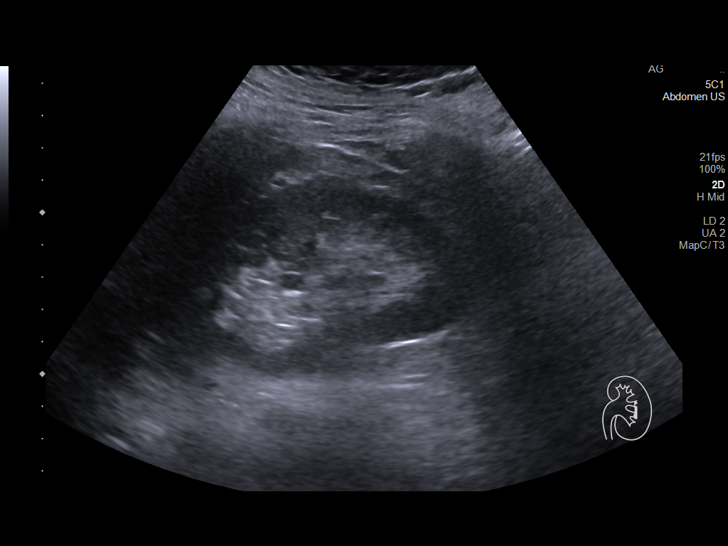
[im 34/45]
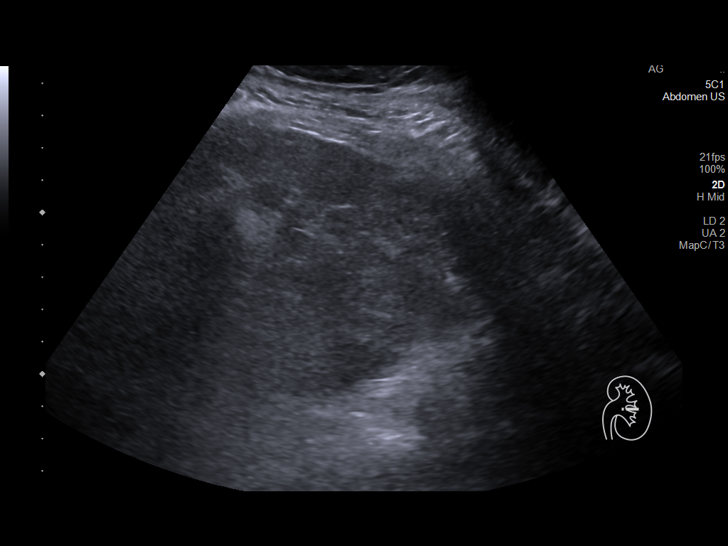
[im 37/45]
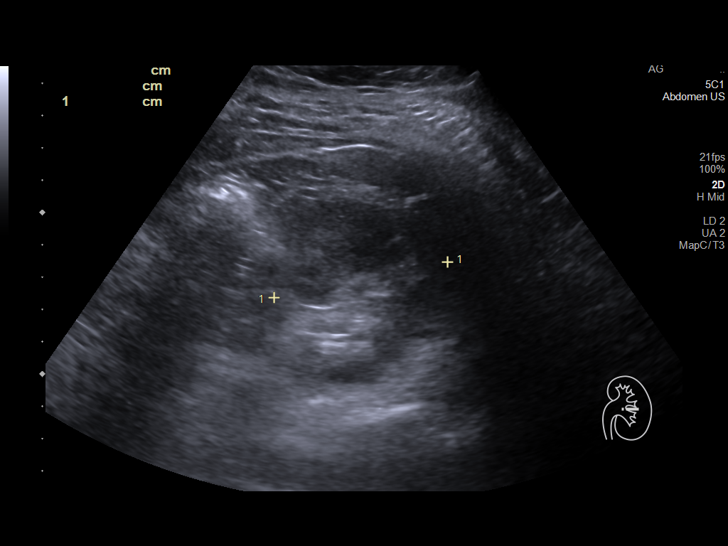
[im 41/45]
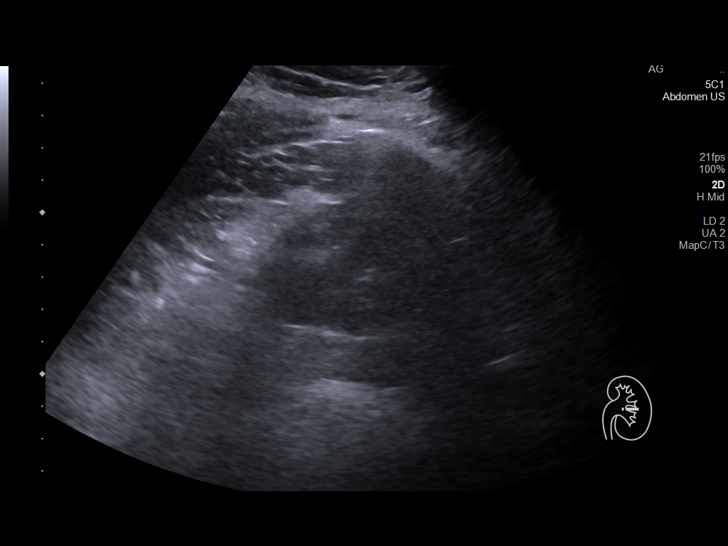
[im 45/45]
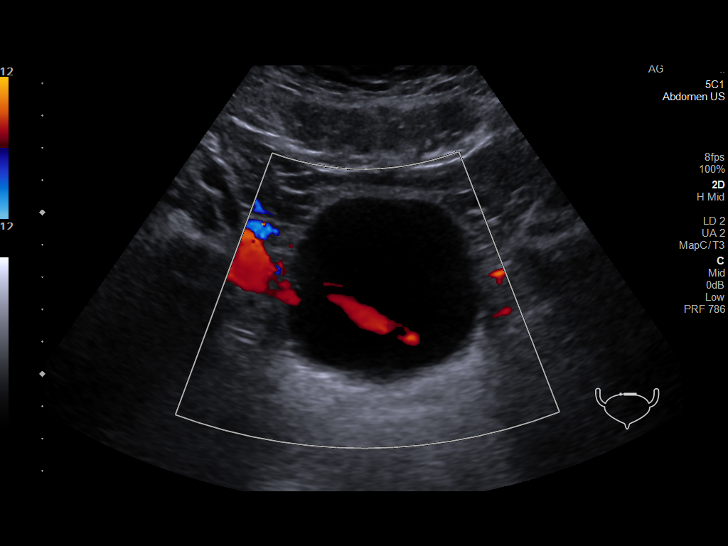

[14 of 25 positions shown; findings below may reference images not displayed]

FINDINGS: Right Kidney:

Renal measurements: 10.7 x 5.7 x 5.0 cm = volume: 159.0 mL.
Echogenicity within normal limits. No mass or hydronephrosis
visualized.

Left Kidney:

Renal measurements: 11.0 x 6.0 x 5.5 cm = volume: 187.1 mL.
Echogenicity within normal limits. No mass or hydronephrosis
visualized.

Bladder:

Appears normal for degree of bladder distention.

Other:

Bilateral ureteral jets are seen.
IMPRESSION: Normal renal ultrasound.

## 2022-12-01 ENCOUNTER — Ambulatory Visit (INDEPENDENT_AMBULATORY_CARE_PROVIDER_SITE_OTHER): Payer: Medicaid Other | Admitting: Family Medicine

## 2022-12-01 ENCOUNTER — Encounter: Payer: Self-pay | Admitting: Family Medicine

## 2022-12-01 VITALS — BP 120/78 | HR 62 | Temp 97.7°F | Ht 70.0 in | Wt 234.0 lb

## 2022-12-01 DIAGNOSIS — L309 Dermatitis, unspecified: Secondary | ICD-10-CM | POA: Diagnosis not present

## 2022-12-01 DIAGNOSIS — M62838 Other muscle spasm: Secondary | ICD-10-CM | POA: Insufficient documentation

## 2022-12-01 NOTE — Progress Notes (Signed)
Subjective:  HPI: Joseph Whitehead is a 27 y.o. male presenting on 12/01/2022 for Follow-up (f/u on stomach issues;constipation and fluttery feeling - JBG\\\)   HPI Patient is in today for left side spasms and "bubbling sensation" for 2-3 days, it is intermittent and seems to be relieved when he burps or passes gas. This has not happened in the past. He reports regular soft bowel movements daily without blood, diarrhea, constipation, vomiting, or nausea. No worsening heartburn or gas, he uses Tums for this when needed. No shortness of breath, wheezing, or pleurisy. He has been exercising for a few months and has recently increased the weight he is lifting. He has increased his upper body weight lifting exercises. Has tried nothing. He also requests a refill on his eczema cream as he has had an insurance change and his dermatologist will no longer refill. He uses a Set designer and has tried several different creams, will notify office what cream he would like. He is in search of a new dermatologist as well that accepts his insurance coverage.  Review of Systems  All other systems reviewed and are negative.   Relevant past medical history reviewed and updated as indicated.   Past Medical History:  Diagnosis Date   GERD (gastroesophageal reflux disease)    Reflux      Past Surgical History:  Procedure Laterality Date   WISDOM TOOTH EXTRACTION      Allergies and medications reviewed and updated.  No current outpatient medications on file.  Allergies  Allergen Reactions   Penicillins Other (See Comments)    Pt. Is unsure of reaction.  Did it involve swelling of the face/tongue/throat, SOB, or low BP? Unknown Did it involve sudden or severe rash/hives, skin peeling, or any reaction on the inside of your mouth or nose? Unknown Did you need to seek medical attention at a hospital or doctor's office? Unknown When did it last happen? Child       If all above answers are  "NO", may proceed with cephalosporin use.     Objective:   BP 120/78   Pulse 62   Temp 97.7 F (36.5 C) (Oral)   Ht 5\' 10"  (1.778 m)   Wt 234 lb (106.1 kg)   SpO2 97%   BMI 33.58 kg/m      12/01/2022    8:04 AM 08/07/2020    3:26 PM 06/19/2020   10:22 AM  Vitals with BMI  Height 5\' 10"  5' 8.5" 5' 9.5"  Weight 234 lbs 225 lbs 3 oz 222 lbs  BMI 33.58 33.74 32.32  Systolic 120 140 161  Diastolic 78 80 64  Pulse 62 96 86     Physical Exam Vitals and nursing note reviewed.  Constitutional:      Appearance: Normal appearance. He is normal weight.  HENT:     Head: Normocephalic and atraumatic.  Cardiovascular:     Rate and Rhythm: Normal rate and regular rhythm.     Pulses: Normal pulses.     Heart sounds: Normal heart sounds.  Pulmonary:     Effort: Pulmonary effort is normal.     Breath sounds: Normal breath sounds.  Skin:    General: Skin is warm and dry.     Capillary Refill: Capillary refill takes less than 2 seconds.  Neurological:     General: No focal deficit present.     Mental Status: He is alert and oriented to person, place, and time. Mental status is at baseline.  Psychiatric:        Mood and Affect: Mood normal.        Behavior: Behavior normal.        Thought Content: Thought content normal.        Judgment: Judgment normal.     Assessment & Plan:  Muscle spasm Assessment & Plan: Symptoms consistent with musculoskeletal spasms associated with weight training. I encouraged him to perform stretching exercises, use Ibuprofen or heat for pain, and we discussed the potential need for muscle relaxers but he declined at this time. Return to office if symptoms persist or worsen.   Eczema, unspecified type Assessment & Plan: Chronic history of eczema for which he has been seeing Dermatology. Will place referral to new derm since his insurance has changed.   Orders: -     Ambulatory referral to Dermatology     Follow up plan: Return if symptoms  worsen or fail to improve.  Park Meo, FNP

## 2022-12-01 NOTE — Assessment & Plan Note (Signed)
Chronic history of eczema for which he has been seeing Dermatology. Will place referral to new derm since his insurance has changed.

## 2022-12-01 NOTE — Assessment & Plan Note (Signed)
Symptoms consistent with musculoskeletal spasms associated with weight training. I encouraged him to perform stretching exercises, use Ibuprofen or heat for pain, and we discussed the potential need for muscle relaxers but he declined at this time. Return to office if symptoms persist or worsen.

## 2022-12-06 ENCOUNTER — Telehealth: Payer: Self-pay | Admitting: Family Medicine

## 2022-12-06 NOTE — Telephone Encounter (Signed)
Patient called back; requesting for referral to [facility]. Physical already scheduled.

## 2022-12-06 NOTE — Telephone Encounter (Signed)
Prescription Request  12/06/2022  LOV: 12/01/2022  What is the name of the medication or equipment?   triamcinolone cream (KENALOG) 0.1 % [[id number]]  DISCONTINUED  **Patient needs new script; used last of cream last night** ** also requesting referral to dermatologist**  Have you contacted your pharmacy to request a refill? Yes   Which pharmacy would you like this sent to?  [id number] [place], [place] - [address] [place] [address] Phone: [phone] Fax: [phone]    Patient notified that their request is being sent to the clinical staff for review and that they should receive a response within 2 business days.   Please advise patient at [phone].

## 2022-12-07 ENCOUNTER — Other Ambulatory Visit: Payer: Self-pay | Admitting: Family Medicine

## 2022-12-07 ENCOUNTER — Other Ambulatory Visit: Payer: Self-pay

## 2022-12-07 DIAGNOSIS — L309 Dermatitis, unspecified: Secondary | ICD-10-CM

## 2022-12-07 MED ORDER — TRIAMCINOLONE ACETONIDE 0.1 % EX OINT: TOPICAL_OINTMENT | Freq: Two times a day (BID) | CUTANEOUS | 1 refills | Status: DC

## 2022-12-08 NOTE — Telephone Encounter (Signed)
[facility] called to follow up on referral and to confirm patient's insurance as [employer]; stated they will fax a form required by insurance for provider to complete and sign which they must receive before they can schedule patient's appointment.  Form will be faxed today and placed on provider's desk for completion.

## 2022-12-08 NOTE — Telephone Encounter (Signed)
Fax received and placed on desk of nurse for completion. Please see previous message in thread.

## 2022-12-08 NOTE — Telephone Encounter (Signed)
Patient following up on referral to [facility]; stated he left a message but hasn't received a call back. The eczema flareup is extreme. Patient asked if there's a different office accepting appointments for him to be seen this week.   Please advise at [phone]

## 2022-12-09 ENCOUNTER — Encounter: Payer: Self-pay | Admitting: Dermatology

## 2022-12-09 ENCOUNTER — Ambulatory Visit (INDEPENDENT_AMBULATORY_CARE_PROVIDER_SITE_OTHER): Payer: Medicaid Other | Admitting: Dermatology

## 2022-12-09 VITALS — BP 121/85

## 2022-12-09 DIAGNOSIS — R21 Rash and other nonspecific skin eruption: Secondary | ICD-10-CM

## 2022-12-09 DIAGNOSIS — L906 Striae atrophicae: Secondary | ICD-10-CM

## 2022-12-09 IMAGING — US US SCROTUM W/ DOPPLER COMPLETE
1 series · 15 of 25 positions shown · non-contrast
Comparison: Ultrasound 07/31/2019

CLINICAL DATA: right side testicular pain

EXAM:
SCROTAL ULTRASOUND
DOPPLER ULTRASOUND OF THE TESTICLES
TECHNIQUE: Complete ultrasound examination of the testicles, epididymis, and
other scrotal structures was performed. Color and spectral Doppler
ultrasound were also utilized to evaluate blood flow to the
testicles.

[Series 1: us art/ven flow abd pelv doppl mc & wl · 15 of 51 slices shown]
[im 1/51]
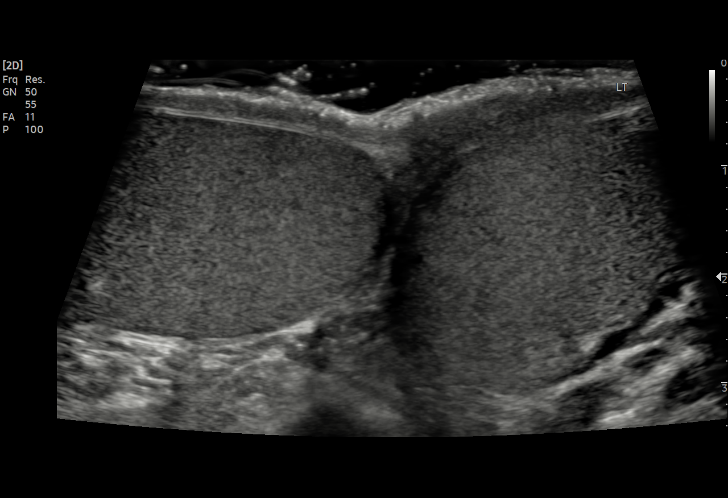
[im 5/51]
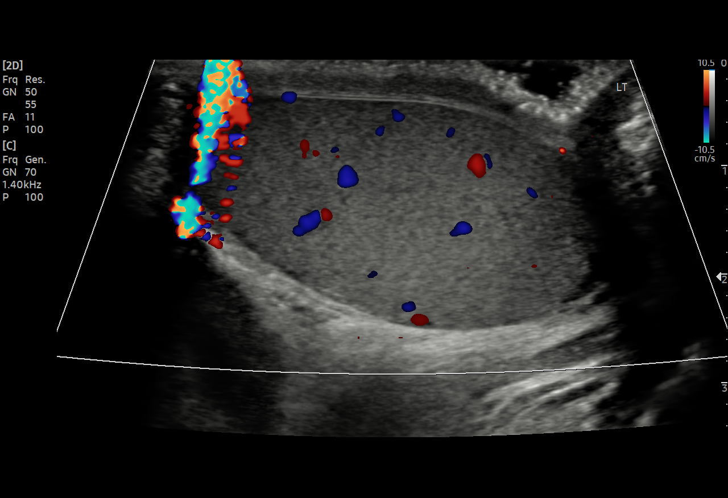
[im 9/51]
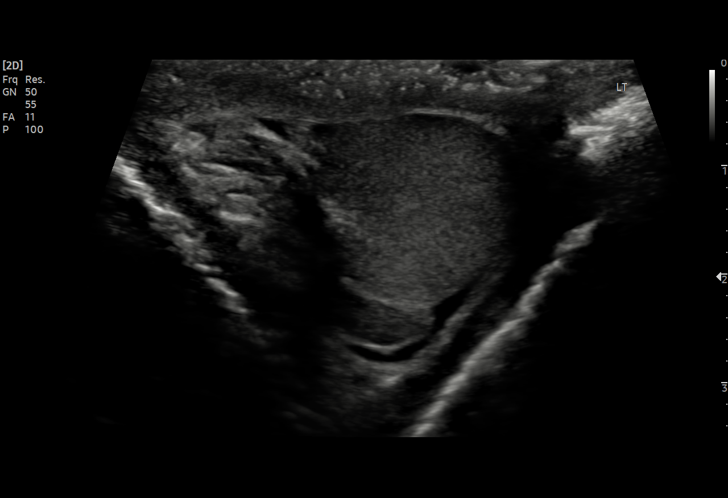
[im 11/51]
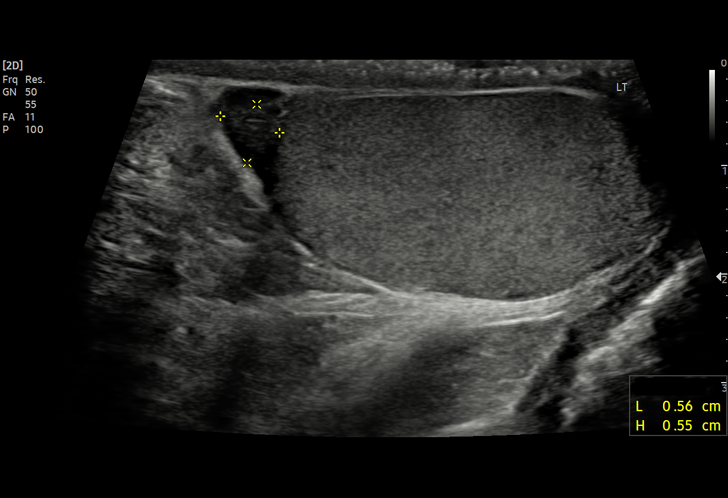
[im 15/51]
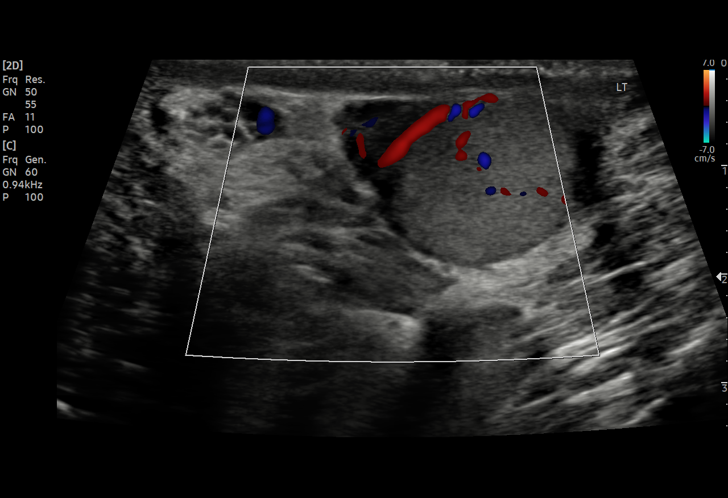
[im 19/51]
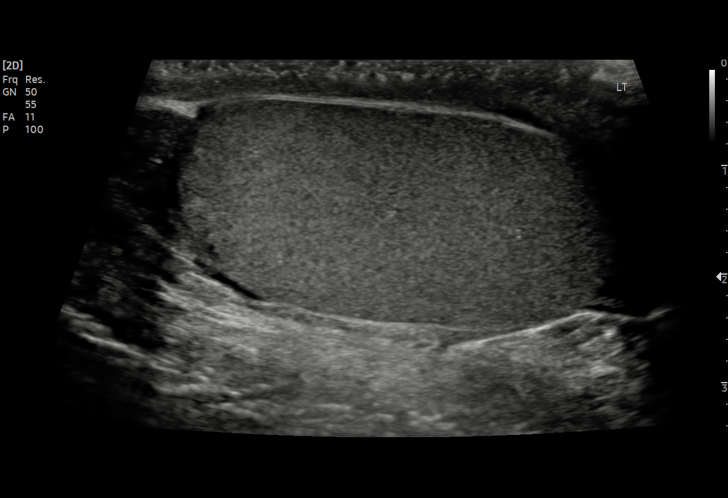
[im 21/51]
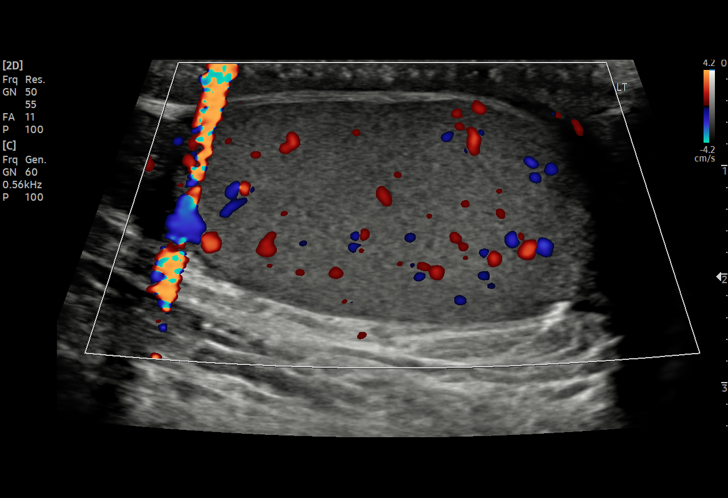
[im 26/51]
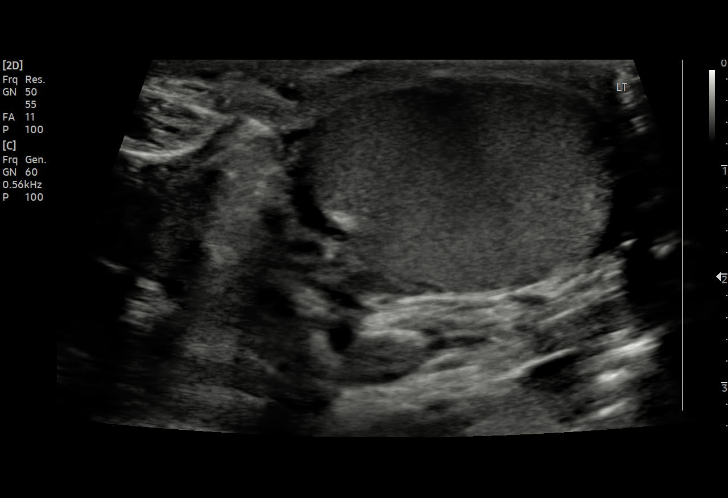
[im 30/51]
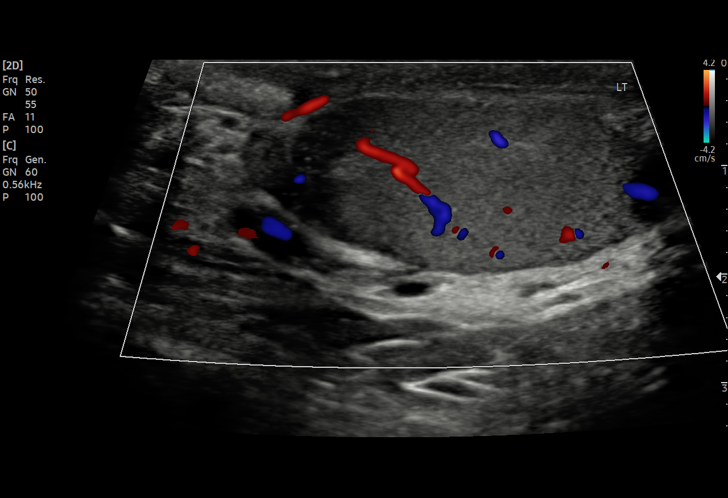
[im 32/51]
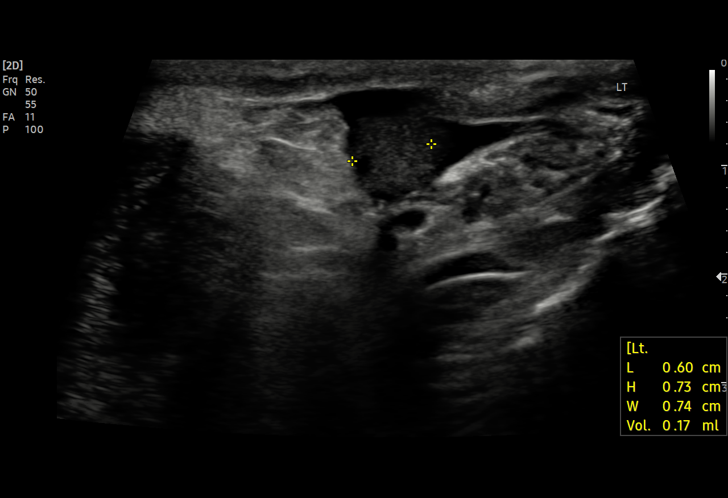
[im 36/51]
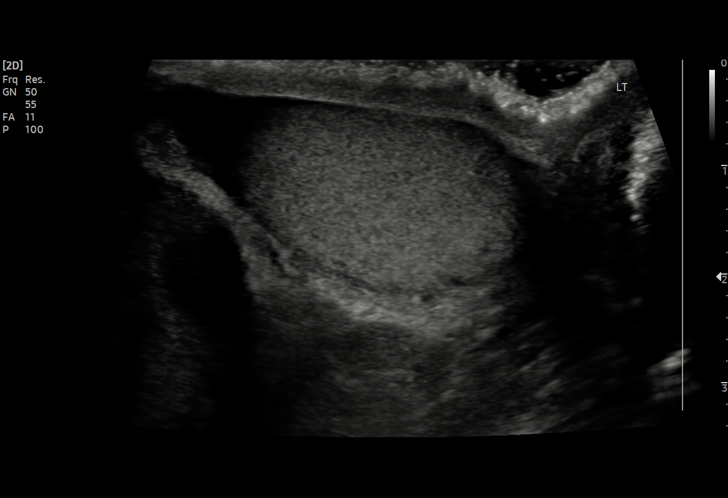
[im 40/51]
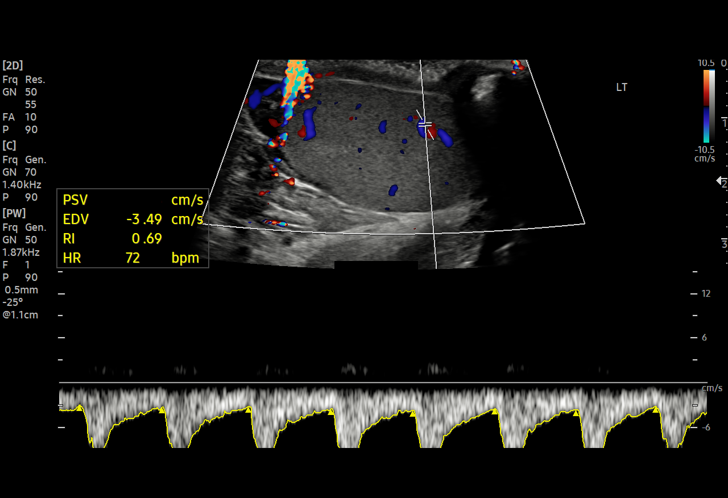
[im 42/51]
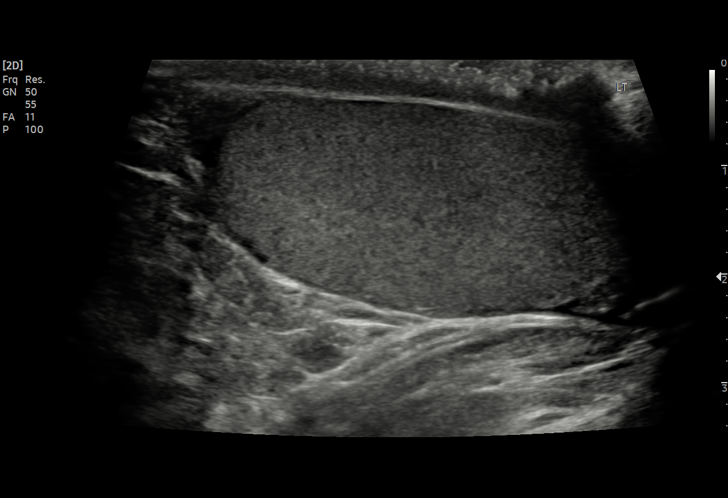
[im 46/51]
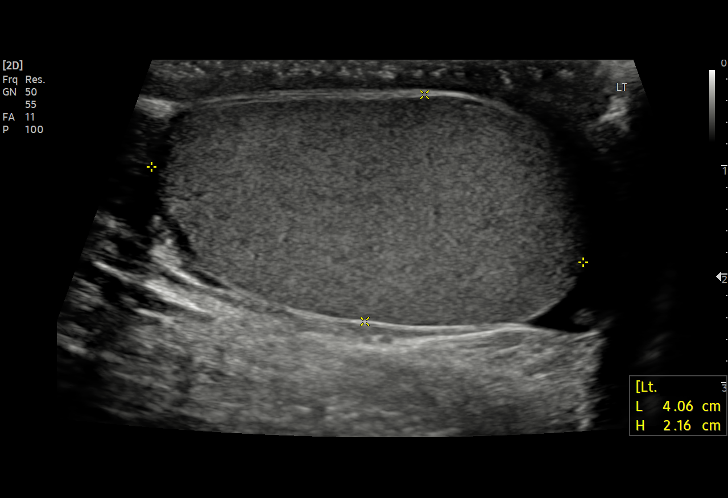
[im 51/51]
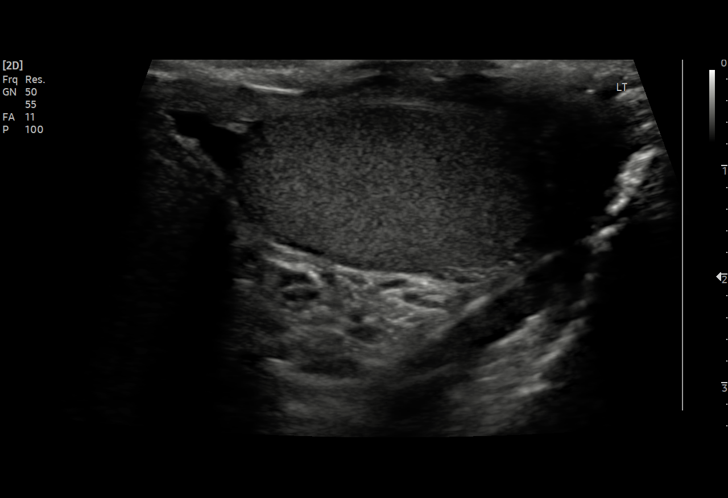

[15 of 25 positions shown; findings below may reference images not displayed]

FINDINGS: Right testicle

Measurements: 4.2 x 1.9 x 2.6. No mass or microlithiasis visualized.

Left testicle

Measurements: 4.1 x 2.2 x 2.7. No mass or microlithiasis visualized.

Right epididymis:  Normal in size and appearance.

Left epididymis:  Normal in size and appearance.

Hydrocele:  Small right hydrocele.

Varicocele:  None visualized.

Pulsed Doppler interrogation of both testes demonstrates normal low
resistance arterial and venous waveforms bilaterally.
IMPRESSION: Unremarkable sonographic appearance and Doppler evaluation of the
testicles. Small right-sided hydrocele.

## 2022-12-09 MED ORDER — DOXYCYCLINE MONOHYDRATE 100 MG PO CAPS: 100.0000 mg | ORAL_CAPSULE | Freq: Two times a day (BID) | ORAL | 0 refills | Status: DC

## 2022-12-09 MED ORDER — TACROLIMUS 0.1 % EX OINT: TOPICAL_OINTMENT | Freq: Two times a day (BID) | CUTANEOUS | 11 refills | Status: DC

## 2022-12-09 MED ORDER — FLUOCINOLONE ACETONIDE BODY 0.01 % EX OIL: 1.0000 | TOPICAL_OIL | Freq: Two times a day (BID) | CUTANEOUS | 3 refills | Status: DC

## 2022-12-09 NOTE — Progress Notes (Signed)
   New Patient Visit   Subjective  Joseph Whitehead is a 27 y.o. male who presents for the following: Eczema inner thighs, scrotum, calves, scalp, ~8 yrs, itchy, pt feels citric foods causes flare, pt currently using TMC 0.1% oint bid to inner thighs, groin, pt does not feel it helps, [facility] Tacrolimus  0.1% /Niacinamide 0.1% oint pt used bid and ran out, pt feels the SM mix helped control rash, no hx of drainage on scalp, inner thighs, The patient has spots, moles and lesions to be evaluated, some may be new or changing and the patient may have concern these could be cancer.  New patient referral from Dr. Lynnea Ferrier.  The following portions of the chart were reviewed this encounter and updated as appropriate: medications, allergies, medical history  Review of Systems:  No other skin or systemic complaints except as noted in HPI or Assessment and Plan.  Objective  Well appearing patient in no apparent distress; mood and affect are within normal limits.   A focused examination was performed of the following areas: Scalp, groin, legs, ears, axilla  Relevant exam findings are noted in the Assessment and Plan.    Assessment & Plan    Scalp, groin Exam: boggy erythematous plaques at occipital scalp with no scaling, erythematous edematous plaques on bil medial thighs, lichenification of bil scrotal skin   Chronic and persistent condition with duration or expected duration over one year. Condition is bothersome/symptomatic for patient. Currently flared.   Ddx chronic folliculitis/hidradenitis suppurativa with early dissecting cellulitis on scalp vs other  Treatment Plan: Cont SM51 Tacrolimus 0.1% / Niacinamide 2% Ointment bid  Start Doxycycline 100mg  1 po bid with food and drink Discussed bx, pt declines today Start Fluocinoline oil bid to aa scalp prn flares  Doxycycline should be taken with food to prevent nausea. Do not lay down for 30 minutes after taking. Be  cautious with sun exposure and use good sun protection while on this medication. Pregnant women should not take this medication.    Recommend gentle skin care.   STRIAE Inner thighs possibly r/t to steroid use Exam: striae on bilateral medial thighs  Treatment Plan: Secondary to topical steroid use per patient D/C TMC 0.1% ointment     Return in about 1 month (around 01/08/2023).  I, Ardis Rowan, RMA, am acting as scribe for Elie Goody, MD .   Documentation: I have reviewed the above documentation for accuracy and completeness, and I agree with the above.  Elie Goody, MD

## 2022-12-09 NOTE — Patient Instructions (Addendum)
Doxycycline should be taken with food to prevent nausea. Do not lay down for 30 minutes after taking. Be cautious with sun exposure and use good sun protection while on this medication. Pregnant women should not take this medication.     Instructions for [facility] Medications  One or more of your medications was sent to the [facility] mail order compounding pharmacy. You will receive an email from them and can purchase the medicine through that link. It will then be mailed to your home at the address you confirmed. If for any reason you do not receive an email from them, please check your spam folder. If you still do not find the email, please let us know. [facility] phone number is [phone].    Due to recent changes in healthcare laws, you may see results of your pathology and/or laboratory studies on MyChart before the doctors have had a chance to review them. We understand that in some cases there may be results that are confusing or concerning to you. Please understand that not all results are received at the same time and often the doctors may need to interpret multiple results in order to provide you with the best plan of care or course of treatment. Therefore, we ask that you please give [place] 2 business days to thoroughly review all your results before contacting the office for clarification. Should we see a critical lab result, you will be contacted sooner.   If You Need Anything After Your Visit  If you have any questions or concerns for your doctor, please call our main line at [phone] and press option 4 to reach your doctor's medical assistant. If no one answers, please leave a voicemail as directed and we will return your call as soon as possible. Messages left after 4 pm will be answered the following business day.   You may also send [place] a message via MyChart. We typically respond to MyChart messages within 1-2 business days.  For prescription refills, please ask  your pharmacy to contact our office. Our fax number is [phone].  If you have an urgent issue when the clinic is closed that cannot wait until the next business day, you can page your doctor at the number below.    Please note that while we do our best to be available for urgent issues outside of office hours, we are not available 24/7.   If you have an urgent issue and are unable to reach [place], you may choose to seek medical care at your doctor's office, retail clinic, urgent care center, or emergency room.  If you have a medical emergency, please immediately call 911 or go to the emergency department.  Pager Numbers  - Dr. Gwen Pounds: [phone]  - Dr. Roseanne Reno: [phone]  - Dr. Katrinka Blazing: [phone]   In the event of inclement weather, please call our main line at [phone] for an update on the status of any delays or closures.  Dermatology Medication Tips: Please keep the boxes that topical medications come in in order to help keep track of the instructions about where and how to use these. Pharmacies typically print the medication instructions only on the boxes and not directly on the medication tubes.   If your medication is too expensive, please contact our office at [phone] option 4 or send [place] a message through MyChart.   We are unable to tell what your co-pay for medications will be in advance as this is different depending on your insurance coverage.  However, we may be able to find a substitute medication at lower cost or fill out paperwork to get insurance to cover a needed medication.   If a prior authorization is required to get your medication covered by your insurance company, please allow [place] 1-2 business days to complete this process.  Drug prices often vary depending on where the prescription is filled and some pharmacies may offer cheaper prices.  The website www.goodrx.com contains coupons for medications through different pharmacies. The prices here do not  account for what the cost may be with help from insurance (it may be cheaper with your insurance), but the website can give you the price if you did not use any insurance.  - You can print the associated coupon and take it with your prescription to the pharmacy.  - You may also stop by our office during regular business hours and pick up a GoodRx coupon card.  - If you need your prescription sent electronically to a different pharmacy, notify our office through [facility] or by phone at [phone] option 4.     Si Usted Necesita Algo Despus de Su Visita  Tambin puede enviarnos un mensaje a travs de Clinical cytogeneticist. Por lo general respondemos a los mensajes de MyChart en el transcurso de 1 a 2 das hbiles.  Para renovar recetas, por favor pida a su farmacia que se ponga en contacto con nuestra oficina. Annie Sable de fax es [place] [phone].  Si tiene un asunto urgente cuando la clnica est cerrada y que no puede esperar hasta el siguiente da hbil, puede llamar/localizar a su doctor(a) al nmero que aparece a continuacin.   Por favor, tenga en cuenta que aunque hacemos todo lo posible para estar disponibles para asuntos urgentes fuera del horario de [place], no estamos disponibles las 24 horas del da, los 7 [address]  [address] de la [place].   Si tiene un problema urgente y no puede comunicarse con nosotros, puede optar por buscar atencin mdica  en el consultorio de su doctor(a), en una clnica privada, en un centro de atencin urgente o en una sala de emergencias.  Si tiene Engineer, drilling, por favor llame inmediatamente al 911 o vaya a la sala de emergencias.  Nmeros de bper  - Dr. Gwen Pounds: [phone]  - Dra. Roseanne Reno: [phone]  - Dr. Katrinka Blazing: [phone]   En caso de inclemencias del tiempo, por favor llame a Lacy Duverney principal al [phone] para una actualizacin sobre el [place] de cualquier retraso o cierre.  Consejos para la medicacin en dermatologa: Por  favor, guarde las cajas en las que vienen los medicamentos de uso tpico para ayudarle a seguir las instrucciones sobre dnde y cmo usarlos. Las farmacias generalmente imprimen las instrucciones del medicamento slo en las cajas y no directamente en los tubos del [place].   Si su medicamento es muy caro, por favor, pngase en contacto con Rolm Gala llamando al [phone] y presione la opcin 4 o envenos un mensaje a travs de Clinical cytogeneticist.   No podemos decirle cul ser su copago por los medicamentos por adelantado ya que esto es diferente dependiendo de la cobertura de su seguro. Sin embargo, es posible que podamos encontrar un medicamento sustituto a Audiological scientist un formulario para que el seguro cubra el medicamento que se considera necesario.   Si se requiere una autorizacin previa para que su compaa de seguros [place] su medicamento, por favor permtanos de 1 a 2 das hbiles para completar [address]  Los precios  de los medicamentos varan con frecuencia dependiendo del lugar de dnde se surte la receta y alguna farmacias pueden ofrecer precios ms baratos.  El sitio web www.goodrx.com tiene cupones para medicamentos de Health and safety inspector. Los precios aqu no tienen en cuenta lo que podra costar con la ayuda del seguro (puede ser ms barato con su seguro), pero el sitio web puede darle el precio si no utiliz Tourist information centre manager.  - Puede imprimir el cupn correspondiente y llevarlo con su receta a la farmacia.  - Tambin puede pasar por nuestra oficina durante el horario de atencin regular y Education officer, museum una tarjeta de cupones de GoodRx.  - Si necesita que su receta se enve electrnicamente a una farmacia diferente, informe a nuestra oficina a travs de MyChart de [place] o por telfono llamando al [phone] y presione la opcin 4.

## 2022-12-13 ENCOUNTER — Telehealth: Payer: Self-pay

## 2022-12-13 NOTE — Telephone Encounter (Signed)
Patient called regarding current treatment. He states his itch is getting worse and the Doxycyline and Tacrolimus Ointment are not helping relieve this symptom. He is trying his best not to scratch.  Patient has not started his oil yet, he is waiting to wash his hair.

## 2022-12-14 NOTE — Telephone Encounter (Signed)
Left message for patient to return my call. aw

## 2022-12-14 NOTE — Telephone Encounter (Signed)
Patient advised and scheduled. aw ?

## 2022-12-15 ENCOUNTER — Ambulatory Visit: Payer: Medicaid Other | Admitting: Dermatology

## 2022-12-16 ENCOUNTER — Ambulatory Visit (INDEPENDENT_AMBULATORY_CARE_PROVIDER_SITE_OTHER): Payer: Medicaid Other | Admitting: Dermatology

## 2022-12-16 ENCOUNTER — Encounter: Payer: Self-pay | Admitting: Dermatology

## 2022-12-16 VITALS — BP 133/90

## 2022-12-16 DIAGNOSIS — R21 Rash and other nonspecific skin eruption: Secondary | ICD-10-CM | POA: Diagnosis not present

## 2022-12-16 DIAGNOSIS — T7840XA Allergy, unspecified, initial encounter: Secondary | ICD-10-CM | POA: Diagnosis not present

## 2022-12-16 MED ORDER — KETOCONAZOLE 2 % EX CREA: 1.0000 | TOPICAL_CREAM | Freq: Two times a day (BID) | CUTANEOUS | 1 refills | Status: DC

## 2022-12-16 NOTE — Patient Instructions (Addendum)

## 2022-12-16 NOTE — Progress Notes (Signed)
   Follow-Up Visit   Subjective  Joseph Whitehead is a 27 y.o. male who presents for the following: rash in groin not improving on Tacrolimus/Niacinamide oint mix x 1 day and Doxycycline 100mg  1 po bid, itching and chafing, pt not sleeping well due to itch The patient has spots, moles and lesions to be evaluated, some may be new or changing and the patient may have concern these could be cancer.   The following portions of the chart were reviewed this encounter and updated as appropriate: medications, allergies, medical history  Review of Systems:  No other skin or systemic complaints except as noted in HPI or Assessment and Plan.  Objective  Well appearing patient in no apparent distress; mood and affect are within normal limits.   A focused examination was performed of the following areas: groin  Relevant exam findings are noted in the Assessment and Plan.  R inguineal crease        Assessment & Plan     Neoplasm of skin R inguineal crease  Skin / nail biopsy Type of biopsy: punch   Informed consent: discussed and consent obtained   Anesthesia: the lesion was anesthetized in a standard fashion   Anesthesia comment:  Area prepped with alcohol Anesthetic:  1% lidocaine w/ epinephrine 1-100,000 buffered w/ 8.4% NaHCO3 Punch size:  4 mm Suture size:  4-0 Suture type: nylon   Suture type comment:  X 2 sutures Hemostasis achieved with: suture and pressure   Outcome: patient tolerated procedure well   Post-procedure details: wound care instructions given   Post-procedure details comment:  Ointment and small bandage applied  Specimen 1 - Surgical pathology Differential Diagnosis: D48.5 Intertrigo vs Candidiasis vs Hidradenitis suppurativa vs Folliculitis vs Erythrasma  Check Margins: No  Cont Doxycycline 100mg  1 po bid with food and drink Start Ketoconazole 2% cr bid aa rash groin May cont Tacrolimus/Niacinamide oint bid if pt feels like it is  helping  Doxycycline should be taken with food to prevent nausea. Do not lay down for 30 minutes after taking. Be cautious with sun exposure and use good sun protection while on this medication. Pregnant women should not take this medication.      Return in about 1 week (around 12/23/2022) for suture removal on nurse schedule.  I, Ardis Rowan, RMA, am acting as scribe for Elie Goody, MD .   Documentation: I have reviewed the above documentation for accuracy and completeness, and I agree with the above.  Elie Goody, MD

## 2022-12-20 LAB — SURGICAL PATHOLOGY

## 2022-12-23 ENCOUNTER — Ambulatory Visit: Payer: Medicaid Other

## 2022-12-23 NOTE — Patient Instructions (Addendum)
  Can get from [facility]       [email or web address]

## 2023-01-07 ENCOUNTER — Encounter: Payer: Self-pay | Admitting: Family Medicine

## 2023-01-07 ENCOUNTER — Ambulatory Visit (INDEPENDENT_AMBULATORY_CARE_PROVIDER_SITE_OTHER): Payer: Medicaid Other | Admitting: Family Medicine

## 2023-01-07 VITALS — BP 126/78 | HR 70 | Temp 98.5°F | Ht 70.0 in | Wt 235.6 lb

## 2023-01-07 DIAGNOSIS — Z0000 Encounter for general adult medical examination without abnormal findings: Secondary | ICD-10-CM

## 2023-01-07 MED ORDER — FLUOCINOLONE ACETONIDE BODY 0.01 % EX OIL: 1.0000 | TOPICAL_OIL | Freq: Two times a day (BID) | CUTANEOUS | 3 refills | Status: DC

## 2023-01-07 NOTE — Progress Notes (Signed)
Subjective:    Patient ID: Joseph Whitehead, male    DOB: 03-31-1995, 27 y.o.   MRN: [id number]  HPI Patient is a very pleasant 27 year old African-American gentleman who is here today for physical exam.  He is due for a flu shot.  He defers vaccinations today because he has to go to work.  He would like to come back at a later date to get the vaccines.  He is due for fasting lab work however he has eaten something today and so he would like to do this at another day.  Patient's only medical issue is eczema.  He suffers from severe eczema.  He is seeing dermatology.  They are currently treating with Protopic.  He also suffers from seborrheic dermatitis of the scalp which she takes fluocinolone oil coupled with ketoconazole.  His blood pressure today is excellent.  He does have an elevated BMI.  However he is exercising daily and working as a Psychologist, educational at [employer] Past Medical History:  Diagnosis Date   Eczema    GERD (gastroesophageal reflux disease)    Past Surgical History:  Procedure Laterality Date   WISDOM TOOTH EXTRACTION     Current Outpatient Medications on File Prior to Visit  Medication Sig Dispense Refill   ketoconazole (NIZORAL) 2 % cream Apply 1 Application topically 2 (two) times daily. Bid to aa rash groin 60 g 1   tacrolimus (PROTOPIC) 0.1 % ointment Apply topically 2 (two) times daily. Bid to aa rash inner thighs and scrotum until rash clear, then prn flares 60 g 11   triamcinolone ointment (KENALOG) 0.1 % Apply topically 2 (two) times daily. 30 g 1   No current facility-administered medications on file prior to visit.   Allergies  Allergen Reactions   Penicillins Other (See Comments)    Pt. Is unsure of reaction.  Did it involve swelling of the face/tongue/throat, SOB, or low BP? Unknown Did it involve sudden or severe rash/hives, skin peeling, or any reaction on the inside of your mouth or nose? Unknown Did you need to seek medical attention at a hospital or  doctor's office? Unknown When did it last happen? Child       If all above answers are "NO", may proceed with cephalosporin use.    Social History   Socioeconomic History   Marital status: Single    Spouse name: Not on file   Number of children: Not on file   Years of education: Not on file   Highest education level: Not on file  Occupational History   Occupation: housekeeper/front desk  Tobacco Use   Smoking status: Never   Smokeless tobacco: Never  Substance and Sexual Activity   Alcohol use: No   Drug use: No   Sexual activity: Never    Comment: works as Firefighter, at [employer]    Other Topics Concern   Not on file  Social History Narrative   Not on file   Social Determinants of Health   Financial Resource Strain: Not on file  Food Insecurity: Not on file  Transportation Needs: Not on file  Physical Activity: Not on file  Stress: Not on file  Social Connections: Not on file  Intimate Partner Violence: Not on file   Family History  Problem Relation Age of Onset   Diabetes Mother    Hyperlipidemia Mother    Hypertension Mother    Hypertension Father    Cancer Maternal Grandmother        breast  Review of Systems     Objective:   Physical Exam Vitals reviewed.  Constitutional:      General: He is not in acute distress.    Appearance: Normal appearance. He is obese. He is not ill-appearing, toxic-appearing or diaphoretic.  HENT:     Head: Normocephalic and atraumatic.     Right Ear: Tympanic membrane normal.     Left Ear: Tympanic membrane normal.     Nose: No congestion or rhinorrhea.     Mouth/Throat:     Mouth: Mucous membranes are moist.     Pharynx: Oropharynx is clear. No oropharyngeal exudate or posterior oropharyngeal erythema.  Eyes:     Extraocular Movements: Extraocular movements intact.     Conjunctiva/sclera: Conjunctivae normal.     Pupils: Pupils are equal, round, and reactive to light.  Neck:     Vascular: No carotid bruit.   Cardiovascular:     Rate and Rhythm: Normal rate and regular rhythm.     Pulses: Normal pulses.     Heart sounds: Normal heart sounds. No murmur heard.    No friction rub. No gallop.  Pulmonary:     Effort: Pulmonary effort is normal. No respiratory distress.     Breath sounds: Normal breath sounds. No stridor. No wheezing, rhonchi or rales.  Chest:     Chest wall: No tenderness.  Abdominal:     General: Abdomen is flat. Bowel sounds are normal. There is no distension.     Palpations: Abdomen is soft. There is no mass.     Tenderness: There is no abdominal tenderness. There is no guarding or rebound.     Hernia: No hernia is present.  Genitourinary:    Penis: Normal.      Testes: Normal.  Musculoskeletal:     Cervical back: Neck supple.     Right lower leg: No edema.     Left lower leg: No edema.  Lymphadenopathy:     Cervical: No cervical adenopathy.  Skin:    General: Skin is warm.     Coloration: Skin is not jaundiced.     Findings: No bruising, erythema, lesion or rash.  Neurological:     General: No focal deficit present.     Mental Status: He is alert and oriented to person, place, and time. Mental status is at baseline.     Cranial Nerves: No cranial nerve deficit.     Sensory: No sensory deficit.     Motor: No weakness.     Coordination: Coordination normal.     Gait: Gait normal.     Deep Tendon Reflexes: Reflexes normal.  Psychiatric:        Mood and Affect: Mood normal.        Behavior: Behavior normal.        Thought Content: Thought content normal.        Judgment: Judgment normal.           Assessment & Plan:  General medical exam - Plan: CBC with Differential/Platelet, COMPLETE METABOLIC PANEL WITH GFR, Lipid panel Physical exam today is significant only for an elevated BMI.  Because of this I would like to have patient return fasting for CBC, CMP, and a fasting lipid panel.  Blood pressure today is excellent.  Regular anticipatory guidance is  provided.  I did recommend a flu shot.  Patient is not yet due for any cancer screening.  Patient plans to return on Monday for fasting lab work

## 2023-01-10 ENCOUNTER — Other Ambulatory Visit: Payer: Medicaid Other

## 2023-01-10 DIAGNOSIS — Z0000 Encounter for general adult medical examination without abnormal findings: Secondary | ICD-10-CM | POA: Diagnosis not present

## 2023-01-10 LAB — CBC WITH DIFFERENTIAL/PLATELET
Absolute Lymphocytes: 2840 {cells}/uL (ref 850–3900)
Absolute Monocytes: 511 {cells}/uL (ref 200–950)
Basophils Absolute: 71 {cells}/uL (ref 0–200)
Basophils Relative: 1 %
Eosinophils Absolute: 433 {cells}/uL (ref 15–500)
Eosinophils Relative: 6.1 %
HCT: 44.8 % (ref 38.5–50.0)
Hemoglobin: 13.8 g/dL (ref 13.2–17.1)
MCH: 24.2 pg — ABNORMAL LOW (ref 27.0–33.0)
MCHC: 30.8 g/dL — ABNORMAL LOW (ref 32.0–36.0)
MCV: 78.6 fL — ABNORMAL LOW (ref 80.0–100.0)
MPV: 11.1 fL (ref 7.5–12.5)
Monocytes Relative: 7.2 %
Neutro Abs: 3245 {cells}/uL (ref 1500–7800)
Neutrophils Relative %: 45.7 %
Platelets: 273 10*3/uL (ref 140–400)
RBC: 5.7 10*6/uL (ref 4.20–5.80)
RDW: 15.1 % — ABNORMAL HIGH (ref 11.0–15.0)
Total Lymphocyte: 40 %
WBC: 7.1 10*3/uL (ref 3.8–10.8)

## 2023-01-10 LAB — COMPLETE METABOLIC PANEL WITH GFR
AG Ratio: 1.2 (calc) (ref 1.0–2.5)
ALT: 16 U/L (ref 9–46)
AST: 16 U/L (ref 10–40)
Albumin: 4.1 g/dL (ref 3.6–5.1)
Alkaline phosphatase (APISO): 90 U/L (ref 36–130)
BUN: 18 mg/dL (ref 7–25)
CO2: 30 mmol/L (ref 20–32)
Calcium: 9.8 mg/dL (ref 8.6–10.3)
Chloride: 102 mmol/L (ref 98–110)
Creat: 1.08 mg/dL (ref 0.60–1.24)
Globulin: 3.3 g/dL (ref 1.9–3.7)
Glucose, Bld: 95 mg/dL (ref 65–99)
Potassium: 4.4 mmol/L (ref 3.5–5.3)
Sodium: 139 mmol/L (ref 135–146)
Total Bilirubin: 0.5 mg/dL (ref 0.2–1.2)
Total Protein: 7.4 g/dL (ref 6.1–8.1)
eGFR: 96 mL/min/{1.73_m2} (ref >=60)

## 2023-01-10 LAB — LIPID PANEL
Cholesterol: 246 mg/dL — ABNORMAL HIGH (ref <200)
HDL: 46 mg/dL (ref >=40)
LDL Cholesterol (Calc): 184 mg/dL — ABNORMAL HIGH
Non-HDL Cholesterol (Calc): 200 mg/dL — ABNORMAL HIGH (ref <130)
Total CHOL/HDL Ratio: 5.3 (calc) — ABNORMAL HIGH (ref <5.0)
Triglycerides: 61 mg/dL (ref <150)

## 2023-01-11 ENCOUNTER — Ambulatory Visit (INDEPENDENT_AMBULATORY_CARE_PROVIDER_SITE_OTHER): Payer: Medicaid Other | Admitting: Dermatology

## 2023-01-11 ENCOUNTER — Encounter: Payer: Self-pay | Admitting: Dermatology

## 2023-01-11 DIAGNOSIS — L738 Other specified follicular disorders: Secondary | ICD-10-CM | POA: Diagnosis not present

## 2023-01-11 MED ORDER — KETOCONAZOLE 2 % EX SHAM: 1.0000 | MEDICATED_SHAMPOO | Freq: Once | CUTANEOUS | 2 refills | Status: AC

## 2023-01-11 NOTE — Progress Notes (Signed)
   Follow-Up Visit   Subjective  Joseph Whitehead is a 27 y.o. male who presents for the following: rash at groin and scalp. Patient advises groin is better using the tacrolimus/niacinamide and after a course of doxycycline. Ketoconazole cream did not seem to help. Scalp is not improved using fluocinolone oil.   The patient has spots, moles and lesions to be evaluated, some may be new or changing and the patient may have concern these could be cancer.   The following portions of the chart were reviewed this encounter and updated as appropriate: medications, allergies, medical history  Review of Systems:  No other skin or systemic complaints except as noted in HPI or Assessment and Plan.  Objective  Well appearing patient in no apparent distress; mood and affect are within normal limits.   A focused examination was performed of the following areas: scalp  Relevant exam findings are noted in the Assessment and Plan.  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 Katrinka Blazing if not improving and he will send in fluconazole.  Bx at follow up if not improved    Return in about 6 weeks (around 02/22/2023) for with Dr. Katrinka Blazing.  Anise Salvo, RMA, am acting as scribe for Elie Goody, MD .   Documentation: I have reviewed the above documentation for accuracy and completeness, and I agree with the above.  Elie Goody, MD

## 2023-01-11 NOTE — Patient Instructions (Signed)
Discontinue fluocinolone oil  Start ketoconazole 2% shampoo apply twice per week, massage into scalp and leave in for 5 minutes before rinsing out  Due to recent changes in healthcare laws, you may see results of your pathology and/or laboratory studies on MyChart before the doctors have had a chance to review them. We understand that in some cases there may be results that are confusing or concerning to you. Please understand that not all results are received at the same time and often the doctors may need to interpret multiple results in order to provide you with the best plan of care or course of treatment. Therefore, we ask that you please give [place] 2 business days to thoroughly review all your results before contacting the office for clarification. Should we see a critical lab result, you will be contacted sooner.   If You Need Anything After Your Visit  If you have any questions or concerns for your doctor, please call our main line at [phone] and press option 4 to reach your doctor's medical assistant. If no one answers, please leave a voicemail as directed and we will return your call as soon as possible. Messages left after 4 pm will be answered the following business day.   You may also send [place] a message via MyChart. We typically respond to MyChart messages within 1-2 business days.  For prescription refills, please ask your pharmacy to contact our office. Our fax number is [phone].  If you have an urgent issue when the clinic is closed that cannot wait until the next business day, you can page your doctor at the number below.    Please note that while we do our best to be available for urgent issues outside of office hours, we are not available 24/7.   If you have an urgent issue and are unable to reach [place], you may choose to seek medical care at your doctor's office, retail clinic, urgent care center, or emergency room.  If you have a medical emergency, please immediately call  911 or go to the emergency department.  Pager Numbers  - Dr. Gwen Pounds: [phone]  - Dr. Roseanne Reno: [phone]  - Dr. Katrinka Blazing: [phone]   In the event of inclement weather, please call our main line at [phone] for an update on the status of any delays or closures.  Dermatology Medication Tips: Please keep the boxes that topical medications come in in order to help keep track of the instructions about where and how to use these. Pharmacies typically print the medication instructions only on the boxes and not directly on the medication tubes.   If your medication is too expensive, please contact our office at [phone] option 4 or send [place] a message through MyChart.   We are unable to tell what your co-pay for medications will be in advance as this is different depending on your insurance coverage. However, we may be able to find a substitute medication at lower cost or fill out paperwork to get insurance to cover a needed medication.   If a prior authorization is required to get your medication covered by your insurance company, please allow [place] 1-2 business days to complete this process.  Drug prices often vary depending on where the prescription is filled and some pharmacies may offer cheaper prices.  The website www.goodrx.com contains coupons for medications through different pharmacies. The prices here do not account for what the cost may be with help from insurance (it may be cheaper with your insurance), but  the website can give you the price if you did not use any insurance.  - You can print the associated coupon and take it with your prescription to the pharmacy.  - You may also stop by our office during regular business hours and pick up a GoodRx coupon card.  - If you need your prescription sent electronically to a different pharmacy, notify our office through [facility] or by phone at [phone] option 4.     Si Usted Necesita Algo Despus de Su  Visita  Tambin puede enviarnos un mensaje a travs de Clinical cytogeneticist. Por lo general respondemos a los mensajes de MyChart en el transcurso de 1 a 2 das hbiles.  Para renovar recetas, por favor pida a su farmacia que se ponga en contacto con nuestra oficina. Annie Sable de fax es [place] [phone].  Si tiene un asunto urgente cuando la clnica est cerrada y que no puede esperar hasta el siguiente da hbil, puede llamar/localizar a su doctor(a) al nmero que aparece a continuacin.   Por favor, tenga en cuenta que aunque hacemos todo lo posible para estar disponibles para asuntos urgentes fuera del horario de [place], no estamos disponibles las 24 horas del da, los 7 [address]  [address] de la [place].   Si tiene un problema urgente y no puede comunicarse con nosotros, puede optar por buscar atencin mdica  en el consultorio de su doctor(a), en una clnica privada, en un centro de atencin urgente o en una sala de emergencias.  Si tiene Engineer, drilling, por favor llame inmediatamente al 911 o vaya a la sala de emergencias.  Nmeros de bper  - Dr. Gwen Pounds: [phone]  - Dra. Roseanne Reno: [phone]  - Dr. Katrinka Blazing: [phone]   En caso de inclemencias del tiempo, por favor llame a Lacy Duverney principal al [phone] para una actualizacin sobre el [place] de cualquier retraso o cierre.  Consejos para la medicacin en dermatologa: Por favor, guarde las cajas en las que vienen los medicamentos de uso tpico para ayudarle a seguir las instrucciones sobre dnde y cmo usarlos. Las farmacias generalmente imprimen las instrucciones del medicamento slo en las cajas y no directamente en los tubos del [place].   Si su medicamento es muy caro, por favor, pngase en contacto con Rolm Gala llamando al [phone] y presione la opcin 4 o envenos un mensaje a travs de Clinical cytogeneticist.   No podemos decirle cul ser su copago por los medicamentos por adelantado ya que esto es diferente dependiendo de  la cobertura de su seguro. Sin embargo, es posible que podamos encontrar un medicamento sustituto a Audiological scientist un formulario para que el seguro cubra el medicamento que se considera necesario.   Si se requiere una autorizacin previa para que su compaa de seguros [place] su medicamento, por favor permtanos de 1 a 2 das hbiles para completar [address]  Los precios de los medicamentos varan con frecuencia dependiendo del Environmental consultant de dnde se surte la receta y alguna farmacias pueden ofrecer precios ms baratos.  El sitio web www.goodrx.com tiene cupones para medicamentos de Health and safety inspector. Los precios aqu no tienen en cuenta lo que podra costar con la ayuda del seguro (puede ser ms barato con su seguro), pero el sitio web puede darle el precio si no utiliz Tourist information centre manager.  - Puede imprimir el cupn correspondiente y llevarlo con su receta a la farmacia.  - Tambin puede pasar por nuestra oficina durante el horario de atencin regular y Education officer, museum una tarjeta de cupones  de GoodRx.  - Si necesita que su receta se enve electrnicamente a una farmacia diferente, informe a nuestra oficina a travs de MyChart de [place] o por telfono llamando al [phone] y presione la opcin 4.

## 2023-01-14 ENCOUNTER — Encounter: Payer: Self-pay | Admitting: Dermatology

## 2023-01-17 ENCOUNTER — Other Ambulatory Visit: Payer: Self-pay | Admitting: Dermatology

## 2023-01-17 DIAGNOSIS — L738 Other specified follicular disorders: Secondary | ICD-10-CM

## 2023-01-17 MED ORDER — FLUCONAZOLE 150 MG PO TABS: 150.0000 mg | ORAL_TABLET | Freq: Every day | ORAL | 0 refills | Status: AC

## 2023-01-19 ENCOUNTER — Telehealth: Payer: Self-pay | Admitting: Family Medicine

## 2023-01-19 NOTE — Telephone Encounter (Signed)
Patient requesting call back with specific instructions about taking omega 3 2000mg . Please advise at [phone].

## 2023-01-21 NOTE — Telephone Encounter (Signed)
Pt called back in to speak with nurse about his dosage of this med. Please advise.  Cb#: [phone]

## 2023-01-24 ENCOUNTER — Encounter: Payer: Medicaid Other | Admitting: Family Medicine

## 2023-01-25 ENCOUNTER — Encounter: Payer: Self-pay | Admitting: Dermatology

## 2023-01-25 ENCOUNTER — Ambulatory Visit: Payer: Medicaid Other | Admitting: Dermatology

## 2023-01-31 ENCOUNTER — Encounter: Payer: Self-pay | Admitting: Dermatology

## 2023-01-31 ENCOUNTER — Ambulatory Visit (INDEPENDENT_AMBULATORY_CARE_PROVIDER_SITE_OTHER): Payer: Medicaid Other | Admitting: Dermatology

## 2023-01-31 DIAGNOSIS — R21 Rash and other nonspecific skin eruption: Secondary | ICD-10-CM

## 2023-01-31 DIAGNOSIS — L309 Dermatitis, unspecified: Secondary | ICD-10-CM | POA: Diagnosis not present

## 2023-01-31 MED ORDER — PREDNISONE 20 MG PO TABS: ORAL_TABLET | ORAL | 0 refills | Status: AC

## 2023-01-31 NOTE — Patient Instructions (Signed)
Start prednisone 60 mg x 3 days, 40 mg x 5 days, 20 mg x 5 days.  Risks of prednisone taper include mood irritability, insomnia, weight gain, stomach ulcers, increased risk of infection, increased blood sugar (diabetes), hypertension, osteoporosis with long-term or frequent use, and rare risk of avascular necrosis of the hip.   Due to recent changes in healthcare laws, you may see results of your pathology and/or laboratory studies on MyChart before the doctors have had a chance to review them. We understand that in some cases there may be results that are confusing or concerning to you. Please understand that not all results are received at the same time and often the doctors may need to interpret multiple results in order to provide you with the best plan of care or course of treatment. Therefore, we ask that you please give [place] 2 business days to thoroughly review all your results before contacting the office for clarification. Should we see a critical lab result, you will be contacted sooner.   If You Need Anything After Your Visit  If you have any questions or concerns for your doctor, please call our main line at [phone] and press option 4 to reach your doctor's medical assistant. If no one answers, please leave a voicemail as directed and we will return your call as soon as possible. Messages left after 4 pm will be answered the following business day.   You may also send [place] a message via MyChart. We typically respond to MyChart messages within 1-2 business days.  For prescription refills, please ask your pharmacy to contact our office. Our fax number is [phone].  If you have an urgent issue when the clinic is closed that cannot wait until the next business day, you can page your doctor at the number below.    Please note that while we do our best to be available for urgent issues outside of office hours, we are not available 24/7.   If you have an urgent issue and are unable to  reach [place], you may choose to seek medical care at your doctor's office, retail clinic, urgent care center, or emergency room.  If you have a medical emergency, please immediately call 911 or go to the emergency department.  Pager Numbers  - Dr. Gwen Pounds: [phone]  - Dr. Roseanne Reno: [phone]  - Dr. Katrinka Blazing: [phone]   In the event of inclement weather, please call our main line at [phone] for an update on the status of any delays or closures.  Dermatology Medication Tips: Please keep the boxes that topical medications come in in order to help keep track of the instructions about where and how to use these. Pharmacies typically print the medication instructions only on the boxes and not directly on the medication tubes.   If your medication is too expensive, please contact our office at [phone] option 4 or send [place] a message through MyChart.   We are unable to tell what your co-pay for medications will be in advance as this is different depending on your insurance coverage. However, we may be able to find a substitute medication at lower cost or fill out paperwork to get insurance to cover a needed medication.   If a prior authorization is required to get your medication covered by your insurance company, please allow [place] 1-2 business days to complete this process.  Drug prices often vary depending on where the prescription is filled and some pharmacies may offer cheaper prices.  The website www.goodrx.com  contains coupons for medications through different pharmacies. The prices here do not account for what the cost may be with help from insurance (it may be cheaper with your insurance), but the website can give you the price if you did not use any insurance.  - You can print the associated coupon and take it with your prescription to the pharmacy.  - You may also stop by our office during regular business hours and pick up a GoodRx coupon card.  - If you need your  prescription sent electronically to a different pharmacy, notify our office through [facility] or by phone at [phone] option 4.

## 2023-01-31 NOTE — Progress Notes (Signed)
   Follow Up Visit   Subjective  Joseph Whitehead is a 27 y.o. male who presents for the following: Rash  At arms, groin, abdomen. Patient not taking fluconazole for pityrosporum folliculitis. Patient requests rx for prednisone.   The following portions of the chart were reviewed this encounter and updated as appropriate: medications, allergies, medical history  Review of Systems:  No other skin or systemic complaints except as noted in HPI or Assessment and Plan.  Objective  Well appearing patient in no apparent distress; mood and affect are within normal limits.  A focused examination was performed of the following areas: Arms, back, abdomen  Relevant exam findings are noted in the Assessment and Plan.    Assessment & Plan     RASH, eczematous dermatitis  Exam: erythematous scaly excoriated papules on bilateral AC fossae, lower abdomen   Treatment Plan: Will not restart fluconazole Start prednisone 60 mg x 3 days, 40 mg x 5 days, 20 mg x 5 days. Return if not improving. Likely start Dupixent if recurrent  Risks of prednisone taper include mood irritability, insomnia, weight gain, stomach ulcers, increased risk of infection, increased blood sugar (diabetes), hypertension, osteoporosis with long-term or frequent use, and rare risk of avascular necrosis of the hip.   Patient will send a message via MyChart and let us know which Skin Medicinals he has used with good results in the past and we will plan on sending refills in for him.    Return in about 2 weeks (around 02/14/2023) for Suture Removal, Rash.  Anise Salvo, RMA, am acting as scribe for Elie Goody, MD .   Documentation: I have reviewed the above documentation for accuracy and completeness, and I agree with the above.  Elie Goody, MD

## 2023-01-31 NOTE — Telephone Encounter (Signed)
Pt called in to ask nurse/pcp how long is supposed to take this med omega 3 2000mg . Pt wanted to speak with nurse to get clarification if he needs to take this med until he comes in for a 6 month f/u with pcp. Please advise.  Cb#: [phone]

## 2023-02-02 ENCOUNTER — Encounter: Payer: Self-pay | Admitting: Dermatology

## 2023-02-10 ENCOUNTER — Encounter: Payer: Self-pay | Admitting: Family Medicine

## 2023-02-14 ENCOUNTER — Ambulatory Visit: Payer: Medicaid Other | Admitting: Dermatology

## 2023-02-16 ENCOUNTER — Ambulatory Visit: Payer: Medicaid Other | Admitting: Dermatology

## 2023-02-22 ENCOUNTER — Ambulatory Visit: Payer: Medicaid Other | Admitting: Dermatology

## 2023-04-11 ENCOUNTER — Ambulatory Visit: Payer: Medicaid Other | Admitting: Dermatology

## 2023-04-19 ENCOUNTER — Ambulatory Visit (INDEPENDENT_AMBULATORY_CARE_PROVIDER_SITE_OTHER): Payer: Medicaid Other | Admitting: Family Medicine

## 2023-04-19 ENCOUNTER — Encounter: Payer: Self-pay | Admitting: Family Medicine

## 2023-04-19 VITALS — BP 100/64 | HR 93 | Temp 99.6°F | Ht 70.0 in | Wt 240.0 lb

## 2023-04-19 DIAGNOSIS — R52 Pain, unspecified: Secondary | ICD-10-CM | POA: Diagnosis not present

## 2023-04-19 DIAGNOSIS — J101 Influenza due to other identified influenza virus with other respiratory manifestations: Secondary | ICD-10-CM | POA: Diagnosis not present

## 2023-04-19 LAB — INFLUENZA A AND B AG, IMMUNOASSAY
INFLUENZA A ANTIGEN: DETECTED — AB
INFLUENZA B ANTIGEN: NOT DETECTED

## 2023-04-19 MED ORDER — OSELTAMIVIR PHOSPHATE 75 MG PO CAPS: 75.0000 mg | ORAL_CAPSULE | Freq: Two times a day (BID) | ORAL | 0 refills | Status: DC

## 2023-04-19 NOTE — Progress Notes (Signed)
Subjective:  HPI: Joseph Whitehead is a 28 y.o. male presenting on 04/19/2023 for Acute Visit (room 2 /fever, cough, chills, body aches since yesterday/)   HPI Patient is in today for 24 hours fever, cough, chills, body aches since yesterday. Denies SOB, wheezing, pleurisy. Has been exposed to his father who also has a cough Has tried Robitussin  Review of Systems  All other systems reviewed and are negative.   Relevant past medical history reviewed and updated as indicated.   Past Medical History:  Diagnosis Date   Eczema    GERD (gastroesophageal reflux disease)      Past Surgical History:  Procedure Laterality Date   WISDOM TOOTH EXTRACTION      Allergies and medications reviewed and updated.   Current Outpatient Medications:    oseltamivir (TAMIFLU) 75 MG capsule, Take 1 capsule (75 mg total) by mouth 2 (two) times daily., Disp: 10 capsule, Rfl: 0   triamcinolone ointment (KENALOG) 0.1 %, Apply topically 2 (two) times daily., Disp: 30 g, Rfl: 1   Fluocinolone Acetonide Body 0.01 % OIL, Apply 1 Application topically 2 (two) times daily. Bid to aa scalp until clear, then prn flares (Patient not taking: Reported on 04/19/2023), Disp: 120 mL, Rfl: 3   tacrolimus (PROTOPIC) 0.1 % ointment, Apply topically 2 (two) times daily. Bid to aa rash inner thighs and scrotum until rash clear, then prn flares (Patient not taking: Reported on 04/19/2023), Disp: 60 g, Rfl: 11  Allergies  Allergen Reactions   Penicillins Other (See Comments)    Pt. Is unsure of reaction.  Did it involve swelling of the face/tongue/throat, SOB, or low BP? Unknown Did it involve sudden or severe rash/hives, skin peeling, or any reaction on the inside of your mouth or nose? Unknown Did you need to seek medical attention at a hospital or doctor's office? Unknown When did it last happen? Child       If all above answers are "NO", may proceed with cephalosporin use.     Objective:   BP 100/64   Pulse  93   Temp 99.6 F (37.6 C) (Oral)   Ht 5\' 10"  (1.778 m)   Wt 240 lb (108.9 kg)   SpO2 96%   BMI 34.44 kg/m      04/19/2023    9:28 AM 01/07/2023    2:55 PM 12/16/2022    8:53 AM  Vitals with BMI  Height 5\' 10"  5\' 10"    Weight 240 lbs 235 lbs 10 oz   BMI 34.44 33.8   Systolic 100 126 161  Diastolic 64 78 90  Pulse 93 70      Physical Exam Vitals and nursing note reviewed.  Constitutional:      Appearance: Normal appearance. He is normal weight. He is ill-appearing.  HENT:     Head: Normocephalic and atraumatic.     Right Ear: Tympanic membrane, ear canal and external ear normal.     Left Ear: Tympanic membrane, ear canal and external ear normal.     Nose: Nose normal.     Mouth/Throat:     Mouth: Mucous membranes are moist.     Pharynx: Oropharynx is clear.  Eyes:     Conjunctiva/sclera: Conjunctivae normal.  Cardiovascular:     Rate and Rhythm: Normal rate and regular rhythm.     Pulses: Normal pulses.     Heart sounds: Normal heart sounds.  Pulmonary:     Effort: Pulmonary effort is normal.  Breath sounds: Normal breath sounds.  Musculoskeletal:     Cervical back: No tenderness.  Lymphadenopathy:     Cervical: No cervical adenopathy.  Skin:    General: Skin is warm and dry.     Capillary Refill: Capillary refill takes less than 2 seconds.  Neurological:     General: No focal deficit present.     Mental Status: He is alert and oriented to person, place, and time. Mental status is at baseline.  Psychiatric:        Mood and Affect: Mood normal.        Behavior: Behavior normal.        Thought Content: Thought content normal.        Judgment: Judgment normal.     Assessment & Plan:  Influenza A Assessment & Plan: Flu A positive. Start Tamiflu 75mg  BID x5d. Counseled on transmission and advised to have his father contact his doctor. Discussed expected course and features suggestive of secondary bacterial infection.  Continue supportive care. Increase  fluid intake with water or electrolyte solution like pedialyte. Encouraged acetaminophen as needed for fever/pain. Encouraged salt water gargling, chloraseptic spray and throat lozenges. Encouraged OTC guaifenesin. Encouraged saline sinus flushes and/or neti with humidified air. Seek medical care if symptoms persist or worsen.    Body aches -     Influenza A and B Ag, Immunoassay  Other orders -     Oseltamivir Phosphate; Take 1 capsule (75 mg total) by mouth 2 (two) times daily.  Dispense: 10 capsule; Refill: 0     Follow up plan: Return if symptoms worsen or fail to improve.  Park Meo, FNP

## 2023-04-19 NOTE — Assessment & Plan Note (Addendum)
Flu A positive. Start Tamiflu 75mg  BID x5d. Counseled on transmission and advised to have his father contact his doctor. Discussed expected course and features suggestive of secondary bacterial infection.  Continue supportive care. Increase fluid intake with water or electrolyte solution like pedialyte. Encouraged acetaminophen as needed for fever/pain. Encouraged salt water gargling, chloraseptic spray and throat lozenges. Encouraged OTC guaifenesin. Encouraged saline sinus flushes and/or neti with humidified air. Seek medical care if symptoms persist or worsen.

## 2023-06-13 ENCOUNTER — Ambulatory Visit (INDEPENDENT_AMBULATORY_CARE_PROVIDER_SITE_OTHER): Admitting: Family Medicine

## 2023-06-13 ENCOUNTER — Encounter: Payer: Self-pay | Admitting: Family Medicine

## 2023-06-13 VITALS — BP 122/80 | HR 91 | Temp 98.4°F | Ht 70.0 in | Wt 237.5 lb

## 2023-06-13 DIAGNOSIS — N50812 Left testicular pain: Secondary | ICD-10-CM | POA: Diagnosis not present

## 2023-06-13 DIAGNOSIS — N50811 Right testicular pain: Secondary | ICD-10-CM | POA: Diagnosis not present

## 2023-06-13 LAB — URINALYSIS, ROUTINE W REFLEX MICROSCOPIC
Bilirubin Urine: NEGATIVE
Glucose, UA: NEGATIVE
Hgb urine dipstick: NEGATIVE
Ketones, ur: NEGATIVE
Leukocytes,Ua: NEGATIVE
Nitrite: NEGATIVE
Protein, ur: NEGATIVE
Specific Gravity, Urine: 1.02 (ref 1.001–1.035)
pH: 6.5 (ref 5.0–8.0)

## 2023-06-13 NOTE — Assessment & Plan Note (Addendum)
 Normal chaperoned GU exam however unable to elicit a cremasteric reflex. This is a recurrence of symptoms for Joseph Whitehead for which he was worked up previously by urology. He declines STI testing, UA was negative in office. No redness, warmth, tenderness to palpation, swelling, fever, malaise, chills. Prior US showed stable hydrocele. Will obtain follow up scrotal US and refer back to urology. Has tried conservative measures including cupping with exercise. Continue NSAIDs PRN and advised to seek immediate medical care if symptoms worsen.

## 2023-06-13 NOTE — Progress Notes (Signed)
 Subjective:  HPI: Joseph Whitehead is a 28 y.o. male presenting on 06/13/2023 for Testicle Pain (X 3-4 days. )   Testicle Pain The patient's primary symptoms include testicular pain.   Patient is in today for 3-4 days of left worse than right testicular pain that radiates into his lower abdomen. He reports the same pain less intense on two other occasions for which he was previously working up with negative scrotal US and urology referral in 2022. This pain is worse when he lifts weights at the gym. It is mild and less intense than in the past. He is not sexually active. Denies dysuria, discharge, rash, fever, chills, body aches. No changes in stool pattern, diarrhea, constipation, blood in stool or urine.   Review of Systems  Genitourinary:  Positive for testicular pain.  All other systems reviewed and are negative.   Relevant past medical history reviewed and updated as indicated.   Past Medical History:  Diagnosis Date   Eczema    GERD (gastroesophageal reflux disease)      Past Surgical History:  Procedure Laterality Date   WISDOM TOOTH EXTRACTION      Allergies and medications reviewed and updated.   Current Outpatient Medications:    tacrolimus (PROTOPIC) 0.1 % ointment, Apply topically 2 (two) times daily. Bid to aa rash inner thighs and scrotum until rash clear, then prn flares, Disp: 60 g, Rfl: 11  Allergies  Allergen Reactions   Penicillins Other (See Comments)    Pt. Is unsure of reaction.  Did it involve swelling of the face/tongue/throat, SOB, or low BP? Unknown Did it involve sudden or severe rash/hives, skin peeling, or any reaction on the inside of your mouth or nose? Unknown Did you need to seek medical attention at a hospital or doctor's office? Unknown When did it last happen? Child       If all above answers are "NO", may proceed with cephalosporin use.     Objective:   BP 122/80   Pulse 91   Temp 98.4 F (36.9 C)   Ht 5\' 10"  (1.778 m)   Wt  237 lb 8 oz (107.7 kg)   SpO2 99%   BMI 34.08 kg/m      06/13/2023   11:56 AM 04/19/2023    9:28 AM 01/07/2023    2:55 PM  Vitals with BMI  Height 5\' 10"  5\' 10"  5\' 10"   Weight 237 lbs 8 oz 240 lbs 235 lbs 10 oz  BMI 34.08 34.44 33.8  Systolic 122 100 409  Diastolic 80 64 78  Pulse 91 93 70     Physical Exam Vitals and nursing note reviewed. Exam conducted with a chaperone present.  Constitutional:      Appearance: Normal appearance. He is normal weight.  HENT:     Head: Normocephalic and atraumatic.  Abdominal:     Hernia: There is no hernia in the left inguinal area or right inguinal area.  Genitourinary:    Pubic Area: No rash.      Penis: Normal.      Testes:        Right: Mass, swelling, testicular hydrocele or varicocele not present. Cremasteric reflex is absent.         Left: Mass, swelling, testicular hydrocele or varicocele not present. Cremasteric reflex is absent.      Epididymis:     Right: Normal.     Left: Normal.  Lymphadenopathy:     Lower Body: No right inguinal adenopathy. No  left inguinal adenopathy.  Skin:    General: Skin is warm and dry.     Capillary Refill: Capillary refill takes less than 2 seconds.  Neurological:     General: No focal deficit present.     Mental Status: He is alert and oriented to person, place, and time. Mental status is at baseline.  Psychiatric:        Mood and Affect: Mood normal.        Behavior: Behavior normal.        Thought Content: Thought content normal.        Judgment: Judgment normal.     Assessment & Plan:  Pain in both testicles Assessment & Plan: Normal chaperoned GU exam however unable to elicit a cremasteric reflex. This is a recurrence of symptoms for [name] for which he was worked up previously by urology. He declines STI testing, UA was negative in office. No redness, warmth, tenderness to palpation, swelling, fever, malaise, chills. Prior US showed stable hydrocele. Will obtain follow up scrotal  US and refer back to urology. Has tried conservative measures including cupping with exercise. Continue NSAIDs PRN and advised to seek immediate medical care if symptoms worsen.   Orders: -     Urinalysis, Routine w reflex microscopic -     US SCROTUM W/DOPPLER     Follow up plan: Return if symptoms worsen or fail to improve.  Park Meo, FNP

## 2023-06-15 ENCOUNTER — Ambulatory Visit

## 2023-06-15 DIAGNOSIS — N50811 Right testicular pain: Secondary | ICD-10-CM

## 2023-06-15 DIAGNOSIS — N50812 Left testicular pain: Secondary | ICD-10-CM | POA: Diagnosis not present

## 2023-06-16 ENCOUNTER — Ambulatory Visit: Admitting: Family Medicine

## 2023-08-01 ENCOUNTER — Ambulatory Visit (INDEPENDENT_AMBULATORY_CARE_PROVIDER_SITE_OTHER): Payer: Medicaid Other | Admitting: Family Medicine

## 2023-08-01 ENCOUNTER — Encounter: Payer: Self-pay | Admitting: Family Medicine

## 2023-08-01 VITALS — BP 124/72 | HR 62 | Temp 98.3°F | Ht 70.0 in | Wt 241.4 lb

## 2023-08-01 DIAGNOSIS — E7800 Pure hypercholesterolemia, unspecified: Secondary | ICD-10-CM

## 2023-08-01 NOTE — Progress Notes (Signed)
 Subjective:    Patient ID: FREEMONT KLUS, male    DOB: 1996/02/04, 28 y.o.   MRN: [id number]  HPI Patient is a very pleasant 28 year old African-American gentleman who is here today for follow-up.  At his physical in October, his cholesterol was found to be extremely high.  His LDL cholesterol was 184.  Patient states that he was eating a lot of meat especially beef at that time.  He has since switched his diet and is trying to eat more fruits and vegetables.  He is also try to eat more baked and grilled foods rather than fried foods.  He tried over-the-counter fish oil however he was unable to tolerate this because of indigestion.  However he was also taking a pill right before he worked out in the morning Past Medical History:  Diagnosis Date   Eczema    GERD (gastroesophageal reflux disease)    Past Surgical History:  Procedure Laterality Date   WISDOM TOOTH EXTRACTION     Current Outpatient Medications on File Prior to Visit  Medication Sig Dispense Refill   tacrolimus  (PROTOPIC ) 0.1 % ointment Apply topically 2 (two) times daily. Bid to aa rash inner thighs and scrotum until rash clear, then prn flares 60 g 11   No current facility-administered medications on file prior to visit.   Allergies  Allergen Reactions   Penicillins Other (See Comments)    Pt. Is unsure of reaction.  Did it involve swelling of the face/tongue/throat, SOB, or low BP? Unknown Did it involve sudden or severe rash/hives, skin peeling, or any reaction on the inside of your mouth or nose? Unknown Did you need to seek medical attention at a hospital or doctor's office? Unknown When did it last happen? Child       If all above answers are "NO", may proceed with cephalosporin use.    Social History   Socioeconomic History   Marital status: Single    Spouse name: Not on file   Number of children: Not on file   Years of education: Not on file   Highest education level: Not on file  Occupational  History   Occupation: housekeeper/front desk  Tobacco Use   Smoking status: Never   Smokeless tobacco: Never  Substance and Sexual Activity   Alcohol use: No   Drug use: No   Sexual activity: Never    Comment: works as Firefighter, at [employer]    Other Topics Concern   Not on file  Social History Narrative   Not on file   Social Drivers of Health   Financial Resource Strain: Not on file  Food Insecurity: Not on file  Transportation Needs: Not on file  Physical Activity: Not on file  Stress: Not on file  Social Connections: Not on file  Intimate Partner Violence: Not on file   Family History  Problem Relation Age of Onset   Diabetes Mother    Hyperlipidemia Mother    Hypertension Mother    Hypertension Father    Cancer Maternal Grandmother        breast      Review of Systems     Objective:   Physical Exam Vitals reviewed.  Constitutional:      General: He is not in acute distress.    Appearance: Normal appearance. He is obese. He is not ill-appearing, toxic-appearing or diaphoretic.  HENT:     Head: Normocephalic and atraumatic.     Right Ear: Tympanic membrane normal.  Left Ear: Tympanic membrane normal.     Nose: No congestion or rhinorrhea.     Mouth/Throat:     Mouth: Mucous membranes are moist.     Pharynx: Oropharynx is clear. No oropharyngeal exudate or posterior oropharyngeal erythema.  Eyes:     Extraocular Movements: Extraocular movements intact.     Conjunctiva/sclera: Conjunctivae normal.     Pupils: Pupils are equal, round, and reactive to light.  Neck:     Vascular: No carotid bruit.  Cardiovascular:     Rate and Rhythm: Normal rate and regular rhythm.     Pulses: Normal pulses.     Heart sounds: Normal heart sounds. No murmur heard.    No friction rub. No gallop.  Pulmonary:     Effort: Pulmonary effort is normal. No respiratory distress.     Breath sounds: Normal breath sounds. No stridor. No wheezing, rhonchi or rales.  Chest:      Chest wall: No tenderness.  Abdominal:     General: Abdomen is flat. Bowel sounds are normal. There is no distension.     Palpations: Abdomen is soft. There is no mass.     Tenderness: There is no abdominal tenderness. There is no guarding or rebound.     Hernia: No hernia is present.  Genitourinary:    Penis: Normal.      Testes: Normal.  Musculoskeletal:     Cervical back: Neck supple.     Right lower leg: No edema.     Left lower leg: No edema.  Lymphadenopathy:     Cervical: No cervical adenopathy.  Skin:    General: Skin is warm.     Coloration: Skin is not jaundiced.     Findings: No bruising, erythema, lesion or rash.  Neurological:     General: No focal deficit present.     Mental Status: He is alert and oriented to person, place, and time. Mental status is at baseline.     Cranial Nerves: No cranial nerve deficit.     Sensory: No sensory deficit.     Motor: No weakness.     Coordination: Coordination normal.     Gait: Gait normal.     Deep Tendon Reflexes: Reflexes normal.  Psychiatric:        Mood and Affect: Mood normal.        Behavior: Behavior normal.        Thought Content: Thought content normal.        Judgment: Judgment normal.           Assessment & Plan:  Pure hypercholesterolemia - Plan: Comprehensive metabolic panel with GFR, Lipid panel Return fasting for fasting lipid panel.  Ideally I like to see the patient's LDL cholesterol around 130 or less.  If LDL cholesterol is significantly elevated I would recommend trying Lovaza 2 g daily.  I would also recommend taking the medication at night so that he is less likely to have indigestion while working out.  Patient denies a family history of premature cardiovascular disease.

## 2023-08-02 ENCOUNTER — Other Ambulatory Visit

## 2023-08-02 DIAGNOSIS — E7800 Pure hypercholesterolemia, unspecified: Secondary | ICD-10-CM | POA: Diagnosis not present

## 2023-08-02 LAB — COMPREHENSIVE METABOLIC PANEL WITH GFR
AG Ratio: 1.3 (calc) (ref 1.0–2.5)
ALT: 16 U/L (ref 9–46)
AST: 17 U/L (ref 10–40)
Albumin: 4.1 g/dL (ref 3.6–5.1)
Alkaline phosphatase (APISO): 81 U/L (ref 36–130)
BUN: 19 mg/dL (ref 7–25)
CO2: 28 mmol/L (ref 20–32)
Calcium: 9.2 mg/dL (ref 8.6–10.3)
Chloride: 104 mmol/L (ref 98–110)
Creat: 1.03 mg/dL (ref 0.60–1.24)
Globulin: 3.2 g/dL (ref 1.9–3.7)
Glucose, Bld: 88 mg/dL (ref 65–99)
Potassium: 4.3 mmol/L (ref 3.5–5.3)
Sodium: 140 mmol/L (ref 135–146)
Total Bilirubin: 0.2 mg/dL (ref 0.2–1.2)
Total Protein: 7.3 g/dL (ref 6.1–8.1)
eGFR: 101 mL/min/{1.73_m2} (ref >=60)

## 2023-08-02 LAB — LIPID PANEL
Cholesterol: 233 mg/dL — ABNORMAL HIGH (ref <200)
HDL: 39 mg/dL — ABNORMAL LOW (ref >=40)
LDL Cholesterol (Calc): 178 mg/dL — ABNORMAL HIGH
Non-HDL Cholesterol (Calc): 194 mg/dL — ABNORMAL HIGH (ref <130)
Total CHOL/HDL Ratio: 6 (calc) — ABNORMAL HIGH (ref <5.0)
Triglycerides: 61 mg/dL (ref <150)

## 2023-08-04 ENCOUNTER — Other Ambulatory Visit: Payer: Self-pay

## 2023-08-04 ENCOUNTER — Ambulatory Visit: Payer: Self-pay | Admitting: Family Medicine

## 2023-08-04 MED ORDER — OMEGA-3-ACID ETHYL ESTERS 1 G PO CAPS: 2.0000 g | ORAL_CAPSULE | Freq: Every day | ORAL | 0 refills | Status: AC

## 2024-01-04 ENCOUNTER — Telehealth: Admitting: Family Medicine

## 2024-01-04 ENCOUNTER — Ambulatory Visit: Payer: Self-pay

## 2024-01-04 DIAGNOSIS — R197 Diarrhea, unspecified: Secondary | ICD-10-CM

## 2024-01-04 NOTE — Telephone Encounter (Signed)
 FYI Only or Action Required?: FYI only for provider.  Patient was last seen in primary care on 08/01/2023 by Duanne Butler DASEN, MD.  Called Nurse Triage reporting Diarrhea.  Symptoms began several days ago.  Interventions attempted: Rest, hydration, or home remedies.  Symptoms are: gradually improving.  Triage Disposition: Home Care  Patient/caregiver understands and will follow disposition?: Yes Reason for Disposition  MILD-MODERATE diarrhea (e.g., 1-6 times / day more than normal)  Answer Assessment - Initial Assessment Questions Patient had to call out of work, patient is looking for a doctors note. Scheduled Virtual UC for patient, no PCP visits available today.   1. DIARRHEA SEVERITY: How bad is the diarrhea? How many more stools have you had in the past 24 hours than normal?      On the toilet all day on Tuesday, feeling a little better today  2. ONSET: When did the diarrhea begin?      Tuesday early morning   3. STOOL DESCRIPTION:  How loose or watery is the diarrhea? What is the stool color? Is there any blood or mucous in the stool?     Watery , now its getting loose.  4. VOMITING: Are you also vomiting? If Yes, ask: How many times in the past 24 hours?      Denies  5. ABDOMEN PAIN: Are you having any abdomen pain? If Yes, ask: What does it feel like? (e.g., crampy, dull, intermittent, constant)      Crampy when has to go to the bathroom  6. ABDOMEN PAIN SEVERITY: If present, ask: How bad is the pain?  (e.g., Scale 1-10; mild, moderate, or severe)     Mild to moderate  7. HYDRATION: Any signs of dehydration? (e.g., dry mouth [not just dry lips], too weak to stand, dizziness, new weight loss) When did you last urinate?     Denies, drinking a lot of water  8. OTHER SYMPTOMS: Do you have any other symptoms? (e.g., fever, blood in stool)      Chills last night, low grade fever 99.9 Temp now is 98.7. lower back hurting and throat hurting last  night.  Protocols used: [facility]  Copied from CRM [id number]. Topic: Clinical - Red Word Triage >> Jan 04, 2024 12:06 PM Wess RAMAN wrote: Red Word that prompted transfer to Nurse Triage:  Patient had to call out of work due to having a stomach bug and would like a doctor's excuse.  Diarrhea for 2 days, fever, chills. starting to feel a little better.

## 2024-01-04 NOTE — Patient Instructions (Signed)
 Joseph Whitehead, thank you for joining Roosvelt Mater, PA-C for today's virtual visit.  While this provider is not your primary care provider (PCP), if your PCP is located in our provider database this encounter information will be shared with them immediately following your visit.   A [facility] MyChart account gives you access to today's visit and all your visits, tests, and labs performed at [facility]  click here if you don't have a [facility] MyChart account or go to [email or web address]  Consent: (Patient) Joseph Whitehead provided verbal consent for this virtual visit at the beginning of the encounter.  Current Medications:  Current Outpatient Medications:    omega-3 acid ethyl esters (LOVAZA ) 1 g capsule, Take 2 capsules (2 g total) by mouth daily., Disp: 180 capsule, Rfl: 0   tacrolimus  (PROTOPIC ) 0.1 % ointment, Apply topically 2 (two) times daily. Bid to aa rash inner thighs and scrotum until rash clear, then prn flares, Disp: 60 g, Rfl: 11   Medications ordered in this encounter:  No orders of the defined types were placed in this encounter.    *If you need refills on other medications prior to your next appointment, please contact your pharmacy*  Follow-Up: Call back or seek an in-person evaluation if the symptoms worsen or if the condition fails to improve as anticipated.  [facility] [phone]  Other Instructions Viral Gastroenteritis, Adult  Viral gastroenteritis is also known as the stomach flu. This condition may affect your stomach, your small intestine, and your large intestine. It can cause sudden watery poop (diarrhea), fever, and vomiting. This condition is caused by certain germs (viruses). These germs can be passed from person to person very easily (are contagious). Having watery poop and vomiting can make you feel weak and cause you to not have enough water in your body (get dehydrated). This can make you tired and  thirsty, make you have a dry mouth, and make it so you pee (urinate) less often. It is important to replace the fluids that you lose from having watery poop and vomiting. What are the causes? You can get sick by catching germs from other people. You can also get sick by: Eating food, drinking water, or touching a surface that has the germs on it (is contaminated). Sharing utensils or other personal items with a person who is sick. What increases the risk? Having a weak body defense system (immune system). Living with one or more children who are younger than 2 years. Living in a nursing home. Going on cruise ships. What are the signs or symptoms? Symptoms of this condition start suddenly. Symptoms may last for a few days or for as long as a week. Common symptoms include: Watery poop. Vomiting. Other symptoms include: Fever. Headache. Feeling tired (fatigue). Pain in the belly (abdomen). Chills. Feeling weak. Feeling like you may vomit (nauseous). Muscle aches. Not feeling hungry. How is this treated? This condition typically goes away on its own. The focus of treatment is to replace the fluids that you lose. This condition may be treated with: An ORS (oral rehydration solution). This is a drink that helps you replace fluids and minerals your body lost. It is sold at pharmacies and stores. Medicines to help with your symptoms. Probiotic supplements to reduce symptoms of watery poop. Fluids given through an IV tube, if needed. Older adults and people with other diseases or a weak body defense system are at higher risk for not having enough water in the  body. Follow these instructions at home: Eating and drinking  Take an ORS as told by your doctor. Drink clear fluids in small amounts as you are able. Clear fluids include: Water. Ice chips. Fruit juice that has water added to it (is diluted). Low-calorie sports drinks. Drink enough fluid to keep your pee (urine) pale  yellow. Eat small amounts of healthy foods every 3-4 hours as you are able. This may include whole grains, fruits, vegetables, lean meats, and yogurt. Avoid fluids that have a lot of sugar or caffeine in them. This includes energy drinks, sports drinks, and soda. Avoid spicy or fatty foods. Avoid alcohol. General instructions  Wash your hands often. This is very important after you have watery poop or you vomit. If you cannot use soap and water, use hand sanitizer. Make sure that all people in your home wash their hands well and often. Take over-the-counter and prescription medicines only as told by your doctor. Rest at home while you get better. Watch your condition for any changes. Take a warm bath to help with any burning or pain from having watery poop. Keep all follow-up visits. Contact a doctor if: You cannot keep fluids down. Your symptoms get worse. You have new symptoms. You feel light-headed or dizzy. You have muscle cramps. Get help right away if: You have chest pain. You have trouble breathing, or you are breathing very fast. You have a fast heartbeat. You feel very weak or you faint. You have a very bad headache, a stiff neck, or both. You have a rash. You have very bad pain, cramping, or bloating in your belly. Your skin feels cold and clammy. You feel mixed up (confused). You have pain when you pee. You have signs of not having enough water in the body, such as: Dark pee, hardly any pee, or no pee. Cracked lips. Dry mouth. Sunken eyes. Feeling very sleepy. Feeling weak. You have signs of bleeding, such as: You see blood in your vomit. Your vomit looks like coffee grounds. You have bloody or black poop or poop that looks like tar. These symptoms may be an emergency. Get help right away. Call 911. Do not wait to see if the symptoms will go away. Do not drive yourself to the hospital. Summary Viral gastroenteritis is also known as the stomach flu. This  condition can cause sudden watery poop (diarrhea), fever, and vomiting. These germs can be passed from person to person very easily. Take an ORS (oral rehydration solution) as told by your doctor. This is a drink that is sold at pharmacies and stores. Wash your hands often, especially after having watery poop or vomiting. If you cannot use soap and water, use hand sanitizer. This information is not intended to replace advice given to you by your health care provider. Make sure you discuss any questions you have with your health care provider. Document Revised: 01/05/2021 Document Reviewed: 01/05/2021 Elsevier Patient Education  2024 Elsevier Inc.   If you have been instructed to have an in-person evaluation today at a local Urgent Care facility, please use the link below. It will take you to a list of all of our available [facility], including address, phone number and hours of operation. Please do not delay care.  [facility]  If you or a family member do not have a primary care provider, use the link below to schedule a visit and establish care. When you choose a [facility] primary care physician or advanced  practice provider, you gain a long-term partner in health. Find a Primary Care Provider  Learn more about [facility] in-office and virtual care options: [facility] - Get Care Now

## 2024-01-04 NOTE — Progress Notes (Signed)
 Virtual Visit Consent   [name], you are scheduled for a virtual visit with a [place] provider today. Just as with appointments in the office, your consent must be obtained to participate. Your consent will be active for this visit and any virtual visit you may have with one of our providers in the next 365 days. If you have a MyChart account, a copy of this consent can be sent to you electronically.  As this is a virtual visit, video technology does not allow for your provider to perform a traditional examination. This may limit your provider's ability to fully assess your condition. If your provider identifies any concerns that need to be evaluated in person or the need to arrange testing (such as labs, EKG, etc.), we will make arrangements to do so. Although advances in technology are sophisticated, we cannot ensure that it will always work on either your end or our end. If the connection with a video visit is poor, the visit may have to be switched to a telephone visit. With either a video or telephone visit, we are not always able to ensure that we have a secure connection.  By engaging in this virtual visit, you consent to the provision of healthcare and authorize for your insurance to be billed (if applicable) for the services provided during this visit. Depending on your insurance coverage, you may receive a charge related to this service.  I need to obtain your verbal consent now. Are you willing to proceed with your visit today? QAIS Whitehead has provided verbal consent on 01/04/2024 for a virtual visit (video or telephone). Joseph Whitehead, [place]  Date: 01/04/2024 1:35 PM   Virtual Visit via Video Note   I, Joseph Whitehead, connected with  ALIK MAWSON  ([id number], [birth year]) on 01/04/24 at  1:30 PM EDT by a video-enabled telemedicine application and verified that I am speaking with the correct person using two identifiers.  Location: Patient: Virtual Visit Location  Patient: Home Provider: Virtual Visit Location Provider: Home Office   I discussed the limitations of evaluation and management by telemedicine and the availability of in person appointments. The patient expressed understanding and agreed to proceed.    History of Present Illness: Joseph Whitehead is a 28 y.o. who identifies as a male who was assigned male at birth, and is being seen today for c/o needing a doctors note because he has a stomach bug.  Pt states he has diarreha, Pt states he has some abdominal pain and some chills yesterday but today feeling better.  Pt states he felt like he was dehydrated yesterday but has drank over 5 bottles of water and is feeling better.  Pt states he is feeling much better than he did yesterday but still has some diarrhea and was told by his boss to stay home and just return to work with a work note. Pt denies fever, chills N/V today  HPI: HPI  Problems:  Patient Active Problem List   Diagnosis Date Noted   Pain in both testicles 06/13/2023   Influenza A 04/19/2023   Muscle spasm 12/01/2022   Eczema 12/01/2022    Allergies:  Allergies  Allergen Reactions   Penicillins Other (See Comments)    Pt. Is unsure of reaction.  Did it involve swelling of the face/tongue/throat, SOB, or low BP? Unknown Did it involve sudden or severe rash/hives, skin peeling, or any reaction on the inside of your mouth or nose? Unknown Did you need to seek  medical attention at a hospital or doctor's office? Unknown When did it last happen? Child       If all above answers are "NO", may proceed with cephalosporin use.    Medications:  Current Outpatient Medications:    omega-3 acid ethyl esters (LOVAZA ) 1 g capsule, Take 2 capsules (2 g total) by mouth daily., Disp: 180 capsule, Rfl: 0   tacrolimus  (PROTOPIC ) 0.1 % ointment, Apply topically 2 (two) times daily. Bid to aa rash inner thighs and scrotum until rash clear, then prn flares, Disp: 60 g, Rfl:  11  Observations/Objective: Patient is well-developed, well-nourished in no acute distress.  Resting comfortably at home.  Head is normocephalic, atraumatic.  No labored breathing.  Speech is clear and coherent with logical content.  Patient is alert and oriented at baseline.    Assessment and Plan: 1. Diarrhea, unspecified type (Primary)  -Pt advised to increase water intake and start a bland diet  -Work note provided for the Pt -Pt to follow up with PCP or urgent care for worsening symptoms  Follow Up Instructions: I discussed the assessment and treatment plan with the patient. The patient was provided an opportunity to ask questions and all were answered. The patient agreed with the plan and demonstrated an understanding of the instructions.  A copy of instructions were sent to the patient via MyChart unless otherwise noted below.    The patient was advised to call back or seek an in-person evaluation if the symptoms worsen or if the condition fails to improve as anticipated.    Joseph Mater, PA-C

## 2024-01-20 ENCOUNTER — Encounter: Payer: Self-pay | Admitting: Dermatology

## 2024-01-23 ENCOUNTER — Other Ambulatory Visit: Payer: Self-pay

## 2024-01-23 MED ORDER — TACROLIMUS 0.1 % EX OINT: TOPICAL_OINTMENT | Freq: Two times a day (BID) | CUTANEOUS | 11 refills | Status: AC

## 2024-02-09 ENCOUNTER — Encounter: Payer: Self-pay | Admitting: Family Medicine

## 2024-02-09 ENCOUNTER — Ambulatory Visit: Admitting: Family Medicine

## 2024-02-09 VITALS — BP 118/78 | HR 66 | Temp 98.0°F | Ht 70.0 in | Wt 241.0 lb

## 2024-02-09 DIAGNOSIS — E7800 Pure hypercholesterolemia, unspecified: Secondary | ICD-10-CM | POA: Diagnosis not present

## 2024-02-09 LAB — LIPID PANEL
Cholesterol: 243 mg/dL — ABNORMAL HIGH (ref <200)
HDL: 36 mg/dL — ABNORMAL LOW (ref >=40)
LDL Cholesterol (Calc): 192 mg/dL — ABNORMAL HIGH
Non-HDL Cholesterol (Calc): 207 mg/dL — ABNORMAL HIGH (ref <130)
Total CHOL/HDL Ratio: 6.8 (calc) — ABNORMAL HIGH (ref <5.0)
Triglycerides: 58 mg/dL (ref <150)

## 2024-02-09 LAB — COMPREHENSIVE METABOLIC PANEL WITH GFR
AG Ratio: 1.5 (calc) (ref 1.0–2.5)
ALT: 13 U/L (ref 9–46)
AST: 18 U/L (ref 10–40)
Albumin: 4.4 g/dL (ref 3.6–5.1)
Alkaline phosphatase (APISO): 76 U/L (ref 36–130)
BUN: 16 mg/dL (ref 7–25)
CO2: 29 mmol/L (ref 20–32)
Calcium: 9.3 mg/dL (ref 8.6–10.3)
Chloride: 102 mmol/L (ref 98–110)
Creat: 1.04 mg/dL (ref 0.60–1.26)
Globulin: 2.9 g/dL (ref 1.9–3.7)
Glucose, Bld: 89 mg/dL (ref 65–99)
Potassium: 4.5 mmol/L (ref 3.5–5.3)
Sodium: 137 mmol/L (ref 135–146)
Total Bilirubin: 0.5 mg/dL (ref 0.2–1.2)
Total Protein: 7.3 g/dL (ref 6.1–8.1)
eGFR: 99 mL/min/1.73m2 (ref >=60)

## 2024-02-09 NOTE — Progress Notes (Signed)
 Subjective:    Patient ID: Joseph Whitehead, male    DOB: 01-02-1996, 28 y.o.   MRN: [id number]  HPI Patient has hyperlipidemia.  Since I last saw the patient, he has dramatically changed his diet.  He states that he is avoiding fried foods.  When he does eat meat he is eating a lot of chicken.  He is trying to avoid pork and red meat.  He is trying to eat more vegetables and fruit.  He is also exercising daily and trying to get cardio on a daily basis. Wt Readings from Last 3 Encounters:  02/09/24 241 lb (109.3 kg)  08/01/23 241 lb 6.4 oz (109.5 kg)  06/13/23 237 lb 8 oz (107.7 kg)   He is no longer taking the fish oil Past Medical History:  Diagnosis Date   Eczema    GERD (gastroesophageal reflux disease)    Hyperlipidemia    Past Surgical History:  Procedure Laterality Date   WISDOM TOOTH EXTRACTION     Current Outpatient Medications on File Prior to Visit  Medication Sig Dispense Refill   omega-3 acid ethyl esters (LOVAZA ) 1 g capsule Take 2 capsules (2 g total) by mouth daily. 180 capsule 0   tacrolimus  (PROTOPIC ) 0.1 % ointment Apply topically 2 (two) times daily. Bid to aa rash inner thighs and scrotum until rash clear, then prn flares 60 g 11   No current facility-administered medications on file prior to visit.   Allergies  Allergen Reactions   Penicillins Other (See Comments)    Pt. Is unsure of reaction.  Did it involve swelling of the face/tongue/throat, SOB, or low BP? Unknown Did it involve sudden or severe rash/hives, skin peeling, or any reaction on the inside of your mouth or nose? Unknown Did you need to seek medical attention at a hospital or doctor's office? Unknown When did it last happen? Child       If all above answers are "NO", may proceed with cephalosporin use.    Social History   Socioeconomic History   Marital status: Single    Spouse name: Not on file   Number of children: Not on file   Years of education: Not on file   Highest  education level: Not on file  Occupational History   Occupation: housekeeper/front desk  Tobacco Use   Smoking status: Never   Smokeless tobacco: Never  Substance and Sexual Activity   Alcohol use: No   Drug use: No   Sexual activity: Never    Comment: works as firefighter, at [employer]    Other Topics Concern   Not on file  Social History Narrative   Not on file   Social Drivers of Health   Financial Resource Strain: Not on file  Food Insecurity: Not on file  Transportation Needs: Not on file  Physical Activity: Not on file  Stress: Not on file  Social Connections: Not on file  Intimate Partner Violence: Not on file   Family History  Problem Relation Age of Onset   Diabetes Mother    Hyperlipidemia Mother    Hypertension Mother    Hypertension Father    Cancer Maternal Grandmother        breast      Review of Systems     Objective:   Physical Exam Vitals reviewed.  Constitutional:      General: He is not in acute distress.    Appearance: Normal appearance. He is obese. He is not ill-appearing, toxic-appearing or  diaphoretic.  HENT:     Head: Normocephalic and atraumatic.     Right Ear: Tympanic membrane normal.     Left Ear: Tympanic membrane normal.     Nose: No congestion or rhinorrhea.     Mouth/Throat:     Mouth: Mucous membranes are moist.     Pharynx: Oropharynx is clear. No oropharyngeal exudate or posterior oropharyngeal erythema.  Eyes:     Extraocular Movements: Extraocular movements intact.     Conjunctiva/sclera: Conjunctivae normal.     Pupils: Pupils are equal, round, and reactive to light.  Neck:     Vascular: No carotid bruit.  Cardiovascular:     Rate and Rhythm: Normal rate and regular rhythm.     Pulses: Normal pulses.     Heart sounds: Normal heart sounds. No murmur heard.    No friction rub. No gallop.  Pulmonary:     Effort: Pulmonary effort is normal. No respiratory distress.     Breath sounds: Normal breath sounds. No stridor.  No wheezing, rhonchi or rales.  Chest:     Chest wall: No tenderness.  Abdominal:     General: Abdomen is flat. Bowel sounds are normal. There is no distension.     Palpations: Abdomen is soft. There is no mass.     Tenderness: There is no abdominal tenderness. There is no guarding or rebound.     Hernia: No hernia is present.  Genitourinary:    Penis: Normal.      Testes: Normal.  Musculoskeletal:     Cervical back: Neck supple.     Right lower leg: No edema.     Left lower leg: No edema.  Lymphadenopathy:     Cervical: No cervical adenopathy.  Skin:    General: Skin is warm.     Coloration: Skin is not jaundiced.     Findings: No bruising, erythema, lesion or rash.  Neurological:     General: No focal deficit present.     Mental Status: He is alert and oriented to person, place, and time. Mental status is at baseline.     Cranial Nerves: No cranial nerve deficit.     Sensory: No sensory deficit.     Motor: No weakness.     Coordination: Coordination normal.     Gait: Gait normal.     Deep Tendon Reflexes: Reflexes normal.  Psychiatric:        Mood and Affect: Mood normal.        Behavior: Behavior normal.        Thought Content: Thought content normal.        Judgment: Judgment normal.           Assessment & Plan:  Pure hypercholesterolemia - Plan: Comprehensive metabolic panel with GFR, Lipid panel  We will check the patient's fasting lipid panel today.  I congratulated him on the lifestyle changes that he is making.  I like to see his LDL cholesterol less than 869

## 2024-02-10 ENCOUNTER — Ambulatory Visit: Payer: Self-pay | Admitting: Family Medicine

## 2024-02-10 ENCOUNTER — Other Ambulatory Visit: Payer: Self-pay

## 2024-02-10 DIAGNOSIS — E7800 Pure hypercholesterolemia, unspecified: Secondary | ICD-10-CM

## 2024-02-10 MED ORDER — ROSUVASTATIN CALCIUM 10 MG PO TABS: 10.0000 mg | ORAL_TABLET | Freq: Every day | ORAL | 1 refills | Status: AC

## 2024-05-10 ENCOUNTER — Other Ambulatory Visit
